# Patient Record
Sex: Male | Born: 1950 | Race: Black or African American | Hispanic: No | Marital: Married | Smoking: Never smoker
Health system: Southern US, Community
[De-identification: ages and names within clinical notes are randomized; demographics above are authoritative.]

## PROBLEM LIST (undated history)

## (undated) DIAGNOSIS — M1990 Unspecified osteoarthritis, unspecified site: Secondary | ICD-10-CM

## (undated) DIAGNOSIS — E119 Type 2 diabetes mellitus without complications: Secondary | ICD-10-CM

## (undated) DIAGNOSIS — J984 Other disorders of lung: Secondary | ICD-10-CM

## (undated) DIAGNOSIS — K219 Gastro-esophageal reflux disease without esophagitis: Secondary | ICD-10-CM

## (undated) DIAGNOSIS — J45909 Unspecified asthma, uncomplicated: Secondary | ICD-10-CM

## (undated) DIAGNOSIS — J449 Chronic obstructive pulmonary disease, unspecified: Secondary | ICD-10-CM

## (undated) DIAGNOSIS — G4730 Sleep apnea, unspecified: Secondary | ICD-10-CM

## (undated) DIAGNOSIS — I519 Heart disease, unspecified: Secondary | ICD-10-CM

## (undated) DIAGNOSIS — R053 Chronic cough: Secondary | ICD-10-CM

## (undated) DIAGNOSIS — J189 Pneumonia, unspecified organism: Secondary | ICD-10-CM

## (undated) DIAGNOSIS — J329 Chronic sinusitis, unspecified: Secondary | ICD-10-CM

## (undated) DIAGNOSIS — E785 Hyperlipidemia, unspecified: Secondary | ICD-10-CM

## (undated) HISTORY — PX: TUMOR REMOVAL: SHX12

## (undated) HISTORY — DX: Other disorders of lung: J98.4

## (undated) HISTORY — PX: ELBOW SURGERY: SHX618

## (undated) HISTORY — DX: Type 2 diabetes mellitus without complications: E11.9

## (undated) HISTORY — PX: OTHER SURGICAL HISTORY: SHX169

## (undated) HISTORY — PX: CIRCUMCISION: SUR203

## (undated) HISTORY — PX: TOE AMPUTATION: SHX809

## (undated) HISTORY — PX: ROTATOR CUFF REPAIR: SHX139

## (undated) HISTORY — DX: Gastro-esophageal reflux disease without esophagitis: K21.9

## (undated) HISTORY — PX: UMBILICAL HERNIA REPAIR: SHX196

---

## 2015-05-10 ENCOUNTER — Encounter (HOSPITAL_COMMUNITY): Payer: Self-pay | Admitting: Emergency Medicine

## 2015-05-10 DIAGNOSIS — Y92410 Unspecified street and highway as the place of occurrence of the external cause: Secondary | ICD-10-CM | POA: Diagnosis not present

## 2015-05-10 DIAGNOSIS — Z79899 Other long term (current) drug therapy: Secondary | ICD-10-CM | POA: Insufficient documentation

## 2015-05-10 DIAGNOSIS — Y998 Other external cause status: Secondary | ICD-10-CM | POA: Diagnosis not present

## 2015-05-10 DIAGNOSIS — S8002XA Contusion of left knee, initial encounter: Secondary | ICD-10-CM | POA: Diagnosis not present

## 2015-05-10 DIAGNOSIS — S46911A Strain of unspecified muscle, fascia and tendon at shoulder and upper arm level, right arm, initial encounter: Secondary | ICD-10-CM | POA: Insufficient documentation

## 2015-05-10 DIAGNOSIS — Y9389 Activity, other specified: Secondary | ICD-10-CM | POA: Insufficient documentation

## 2015-05-10 DIAGNOSIS — S0990XA Unspecified injury of head, initial encounter: Secondary | ICD-10-CM | POA: Diagnosis not present

## 2015-05-10 DIAGNOSIS — S199XXA Unspecified injury of neck, initial encounter: Secondary | ICD-10-CM | POA: Insufficient documentation

## 2015-05-10 DIAGNOSIS — E785 Hyperlipidemia, unspecified: Secondary | ICD-10-CM | POA: Insufficient documentation

## 2015-05-10 DIAGNOSIS — S8992XA Unspecified injury of left lower leg, initial encounter: Secondary | ICD-10-CM | POA: Diagnosis present
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
## 2015-05-11 ENCOUNTER — Emergency Department (HOSPITAL_COMMUNITY)
Admission: EM | Admit: 2015-05-11 | Discharge: 2015-05-11 | Disposition: A | Discharge status: Home / Self Care | Payer: No Typology Code available for payment source | Attending: Emergency Medicine | Admitting: Emergency Medicine

## 2015-05-11 ENCOUNTER — Emergency Department (HOSPITAL_COMMUNITY): Payer: No Typology Code available for payment source

## 2015-05-11 DIAGNOSIS — S8002XA Contusion of left knee, initial encounter: Secondary | ICD-10-CM

## 2015-05-11 DIAGNOSIS — S46811A Strain of other muscles, fascia and tendons at shoulder and upper arm level, right arm, initial encounter: Secondary | ICD-10-CM

## 2015-05-11 DIAGNOSIS — M542 Cervicalgia: Secondary | ICD-10-CM

## 2015-05-11 HISTORY — DX: Hyperlipidemia, unspecified: E78.5

## 2015-05-11 MED ORDER — CYCLOBENZAPRINE HCL 5 MG PO TABS: 5.0000 mg | ORAL_TABLET | Freq: Three times a day (TID) | ORAL | Status: DC | PRN

## 2015-05-11 MED ORDER — NAPROXEN 500 MG PO TABS: 500.0000 mg | ORAL_TABLET | Freq: Two times a day (BID) | ORAL | Status: DC

## 2015-05-11 MED ORDER — NAPROXEN 250 MG PO TABS
500.0000 mg | ORAL_TABLET | Freq: Once | ORAL | Status: AC
  Administered 2015-05-11: 500 mg via ORAL
  Filled 2015-05-11: qty 2

## 2015-05-11 MED ORDER — TRAMADOL HCL 50 MG PO TABS: 100.0000 mg | ORAL_TABLET | Freq: Four times a day (QID) | ORAL | Status: DC | PRN

## 2015-05-11 MED ORDER — METHOCARBAMOL 500 MG PO TABS
1000.0000 mg | ORAL_TABLET | Freq: Once | ORAL | Status: AC
  Administered 2015-05-11: 1000 mg via ORAL
  Filled 2015-05-11: qty 2
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
## 2015-05-11 NOTE — Discharge Instructions (Signed)
Wear the soft collar for comfort. Call [facility] to have your neck reexamined and to evaluate your CT scan of your neck. Use ice packs and heat to the injured areas. Take the medications as prescribed and you can also take acetaminophen 650 mg 4 times a day. Return to the ED for any problems listed on the head injury sheet, or if you get weakness or numbness in your arms or legs.

## 2015-06-24 ENCOUNTER — Emergency Department (HOSPITAL_COMMUNITY)
Admission: EM | Admit: 2015-06-24 | Discharge: 2015-06-24 | Disposition: A | Discharge status: Home / Self Care | Payer: TRICARE For Life (TFL) | Attending: Emergency Medicine | Admitting: Emergency Medicine

## 2015-06-24 ENCOUNTER — Emergency Department (HOSPITAL_COMMUNITY): Payer: TRICARE For Life (TFL)

## 2015-06-24 ENCOUNTER — Encounter (HOSPITAL_COMMUNITY): Payer: Self-pay | Admitting: Emergency Medicine

## 2015-06-24 DIAGNOSIS — R519 Headache, unspecified: Secondary | ICD-10-CM

## 2015-06-24 DIAGNOSIS — Z87828 Personal history of other (healed) physical injury and trauma: Secondary | ICD-10-CM | POA: Insufficient documentation

## 2015-06-24 DIAGNOSIS — Z79899 Other long term (current) drug therapy: Secondary | ICD-10-CM | POA: Insufficient documentation

## 2015-06-24 DIAGNOSIS — R110 Nausea: Secondary | ICD-10-CM | POA: Insufficient documentation

## 2015-06-24 DIAGNOSIS — R51 Headache: Secondary | ICD-10-CM | POA: Diagnosis not present

## 2015-06-24 LAB — I-STAT CHEM 8, ED
BUN: 12 mg/dL (ref 6–20)
CALCIUM ION: 1.14 mmol/L (ref 1.13–1.30)
CREATININE: 1.1 mg/dL (ref 0.61–1.24)
Chloride: 103 mmol/L (ref 101–111)
Glucose, Bld: 133 mg/dL — ABNORMAL HIGH (ref 65–99)
HCT: 40 % (ref 39.0–52.0)
HEMOGLOBIN: 13.6 g/dL (ref 13.0–17.0)
POTASSIUM: 4.8 mmol/L (ref 3.5–5.1)
Sodium: 139 mmol/L (ref 135–145)
TCO2: 26 mmol/L (ref 0–100)

## 2015-06-24 MED ORDER — DEXAMETHASONE SODIUM PHOSPHATE 4 MG/ML IJ SOLN
10.0000 mg | Freq: Once | INTRAMUSCULAR | Status: AC
  Administered 2015-06-24: 10 mg via INTRAVENOUS
  Filled 2015-06-24: qty 3

## 2015-06-24 MED ORDER — IOHEXOL 350 MG/ML SOLN
75.0000 mL | Freq: Once | INTRAVENOUS | Status: AC | PRN
  Administered 2015-06-24: 75 mL via INTRAVENOUS

## 2015-06-24 MED ORDER — VALPROATE SODIUM 500 MG/5ML IV SOLN
500.0000 mg | Freq: Once | INTRAVENOUS | Status: AC
  Administered 2015-06-24: 500 mg via INTRAVENOUS
  Filled 2015-06-24: qty 5

## 2015-06-24 MED ORDER — NAPROXEN 500 MG PO TABS: 500.0000 mg | ORAL_TABLET | Freq: Two times a day (BID) | ORAL | Status: DC

## 2015-06-24 MED ORDER — DIPHENHYDRAMINE HCL 50 MG/ML IJ SOLN
25.0000 mg | Freq: Once | INTRAMUSCULAR | Status: AC
  Administered 2015-06-24: 25 mg via INTRAVENOUS
  Filled 2015-06-24: qty 1

## 2015-06-24 MED ORDER — METOCLOPRAMIDE HCL 5 MG/ML IJ SOLN
10.0000 mg | Freq: Once | INTRAMUSCULAR | Status: AC
  Administered 2015-06-24: 10 mg via INTRAVENOUS
  Filled 2015-06-24: qty 2

## 2015-06-24 MED ORDER — MORPHINE SULFATE 4 MG/ML IJ SOLN
4.0000 mg | Freq: Once | INTRAMUSCULAR | Status: AC
  Administered 2015-06-24: 4 mg via INTRAVENOUS
  Filled 2015-06-24: qty 1

## 2015-06-24 MED ORDER — KETOROLAC TROMETHAMINE 30 MG/ML IJ SOLN
30.0000 mg | Freq: Once | INTRAMUSCULAR | Status: AC
  Administered 2015-06-24: 30 mg via INTRAVENOUS
  Filled 2015-06-24: qty 1

## 2015-06-24 NOTE — Discharge Instructions (Signed)
General Headache Without Cause Call Dr. Merlene Laughter for an appointment as soon as possible. Take antibiotics or this prescribed. Return to the ED if you develop worsening headache, weakness, numbness or tingling or any other concerns. A headache is pain or discomfort felt around the head or neck area. The specific cause of a headache may not be found. There are many causes and types of headaches. A few common ones are:  Tension headaches.  Migraine headaches.  Cluster headaches.  Chronic daily headaches. HOME CARE INSTRUCTIONS   Keep all follow-up appointments with your caregiver or any specialist referral.  Only take over-the-counter or prescription medicines for pain or discomfort as directed by your caregiver.  Lie down in a dark, quiet room when you have a headache.  Keep a headache journal to find out what may trigger your migraine headaches. For example, write down:  What you eat and drink.  How much sleep you get.  Any change to your diet or medicines.  Try massage or other relaxation techniques.  Put ice packs or heat on the head and neck. Use these 3 to 4 times per day for 15 to 20 minutes each time, or as needed.  Limit stress.  Sit up straight, and do not tense your muscles.  Quit smoking if you smoke.  Limit alcohol use.  Decrease the amount of caffeine you drink, or stop drinking caffeine.  Eat and sleep on a regular schedule.  Get 7 to 9 hours of sleep, or as recommended by your caregiver.  Keep lights dim if bright lights bother you and make your headaches worse. SEEK MEDICAL CARE IF:   You have problems with the medicines you were prescribed.  Your medicines are not working.  You have a change from the usual headache.  You have nausea or vomiting. SEEK IMMEDIATE MEDICAL CARE IF:   Your headache becomes severe.  You have a fever.  You have a stiff neck.  You have loss of vision.  You have muscular weakness or loss of muscle control.  You  start losing your balance or have trouble walking.  You feel faint or pass out.  You have severe symptoms that are different from your first symptoms. MAKE SURE YOU:   Understand these instructions.  Will watch your condition.  Will get help right away if you are not doing well or get worse. Document Released: 12/11/2005 Document Revised: 03/04/2012 Document Reviewed: 12/27/2011 [facility] Patient Information 2015 [address], [place]. This information is not intended to replace advice given to you by your health care provider. Make sure you discuss any questions you have with your health care provider.
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
## 2016-02-27 ENCOUNTER — Emergency Department (HOSPITAL_COMMUNITY)

## 2016-02-27 ENCOUNTER — Encounter (HOSPITAL_COMMUNITY): Payer: Self-pay | Admitting: Emergency Medicine

## 2016-02-27 ENCOUNTER — Emergency Department (HOSPITAL_COMMUNITY)
Admission: EM | Admit: 2016-02-27 | Discharge: 2016-02-27 | Disposition: A | Discharge status: Home / Self Care | Attending: Emergency Medicine | Admitting: Emergency Medicine

## 2016-02-27 DIAGNOSIS — E785 Hyperlipidemia, unspecified: Secondary | ICD-10-CM | POA: Insufficient documentation

## 2016-02-27 DIAGNOSIS — J111 Influenza due to unidentified influenza virus with other respiratory manifestations: Secondary | ICD-10-CM | POA: Insufficient documentation

## 2016-02-27 DIAGNOSIS — R509 Fever, unspecified: Secondary | ICD-10-CM | POA: Diagnosis present

## 2016-02-27 DIAGNOSIS — Z79899 Other long term (current) drug therapy: Secondary | ICD-10-CM | POA: Insufficient documentation

## 2016-02-27 LAB — CBC WITH DIFFERENTIAL/PLATELET
BASOS ABS: 0 10*3/uL (ref 0.0–0.1)
BASOS PCT: 0 %
EOS ABS: 0.1 10*3/uL (ref 0.0–0.7)
Eosinophils Relative: 2 %
HCT: 38.1 % — ABNORMAL LOW (ref 39.0–52.0)
Hemoglobin: 12.3 g/dL — ABNORMAL LOW (ref 13.0–17.0)
Lymphocytes Relative: 12 %
Lymphs Abs: 0.8 10*3/uL (ref 0.7–4.0)
MCH: 23.7 pg — AB (ref 26.0–34.0)
MCHC: 32.3 g/dL (ref 30.0–36.0)
MCV: 73.6 fL — ABNORMAL LOW (ref 78.0–100.0)
Monocytes Absolute: 0.8 10*3/uL (ref 0.1–1.0)
Monocytes Relative: 12 %
Neutro Abs: 5.1 10*3/uL (ref 1.7–7.7)
Neutrophils Relative %: 75 %
PLATELETS: 117 10*3/uL — AB (ref 150–400)
RBC: 5.18 MIL/uL (ref 4.22–5.81)
RDW: 13.4 % (ref 11.5–15.5)
WBC: 6.8 10*3/uL (ref 4.0–10.5)

## 2016-02-27 LAB — COMPREHENSIVE METABOLIC PANEL
ALBUMIN: 4.2 g/dL (ref 3.5–5.0)
ALK PHOS: 69 U/L (ref 38–126)
ALT: 23 U/L (ref 17–63)
ANION GAP: 7 (ref 5–15)
AST: 29 U/L (ref 15–41)
BILIRUBIN TOTAL: 1.5 mg/dL — AB (ref 0.3–1.2)
BUN: 10 mg/dL (ref 6–20)
CO2: 26 mmol/L (ref 22–32)
Calcium: 8.8 mg/dL — ABNORMAL LOW (ref 8.9–10.3)
Chloride: 105 mmol/L (ref 101–111)
Creatinine, Ser: 1.42 mg/dL — ABNORMAL HIGH (ref 0.61–1.24)
GFR calc Af Amer: 59 mL/min — ABNORMAL LOW (ref >60)
GFR, EST NON AFRICAN AMERICAN: 51 mL/min — AB (ref >60)
Glucose, Bld: 128 mg/dL — ABNORMAL HIGH (ref 65–99)
POTASSIUM: 3.8 mmol/L (ref 3.5–5.1)
Sodium: 138 mmol/L (ref 135–145)
TOTAL PROTEIN: 7.2 g/dL (ref 6.5–8.1)

## 2016-02-27 MED ORDER — KETOROLAC TROMETHAMINE 30 MG/ML IJ SOLN
30.0000 mg | Freq: Once | INTRAMUSCULAR | Status: AC
  Administered 2016-02-27: 30 mg via INTRAVENOUS
  Filled 2016-02-27: qty 1

## 2016-02-27 MED ORDER — SODIUM CHLORIDE 0.9 % IV BOLUS (SEPSIS)
1000.0000 mL | Freq: Once | INTRAVENOUS | Status: AC
  Administered 2016-02-27: 1000 mL via INTRAVENOUS

## 2016-02-27 MED ORDER — OSELTAMIVIR PHOSPHATE 75 MG PO CAPS
75.0000 mg | ORAL_CAPSULE | Freq: Once | ORAL | Status: AC
  Administered 2016-02-27: 75 mg via ORAL
  Filled 2016-02-27: qty 1

## 2016-02-27 MED ORDER — HYDROCODONE-ACETAMINOPHEN 5-325 MG PO TABS: 1.0000 | ORAL_TABLET | Freq: Four times a day (QID) | ORAL | Status: DC | PRN

## 2016-02-27 MED ORDER — HYDROMORPHONE HCL 1 MG/ML IJ SOLN
1.0000 mg | Freq: Once | INTRAMUSCULAR | Status: AC
  Administered 2016-02-27: 1 mg via INTRAVENOUS
  Filled 2016-02-27: qty 1

## 2016-02-27 MED ORDER — HYDROCODONE-ACETAMINOPHEN 5-325 MG PO TABS
1.0000 | ORAL_TABLET | Freq: Once | ORAL | Status: AC
  Administered 2016-02-27: 1 via ORAL
  Filled 2016-02-27: qty 1

## 2016-02-27 MED ORDER — ONDANSETRON HCL 4 MG/2ML IJ SOLN
4.0000 mg | Freq: Once | INTRAMUSCULAR | Status: AC
  Administered 2016-02-27: 4 mg via INTRAVENOUS
  Filled 2016-02-27: qty 2

## 2016-02-27 MED ORDER — OSELTAMIVIR PHOSPHATE 75 MG PO CAPS: 75.0000 mg | ORAL_CAPSULE | Freq: Two times a day (BID) | ORAL | Status: DC

## 2016-02-27 NOTE — Discharge Instructions (Signed)
Drink plenty of fluids and rest. Follow-up if not improving in next 2-3 days

## 2016-02-27 NOTE — ED Notes (Signed)
Patient c/o cough with fever, body aches, chills, and diarrhea. Denies any nausea or vomiting. Per patient cough nonproductive.

## 2016-02-27 NOTE — ED Provider Notes (Signed)
CSN: [id number]     Arrival date & time 02/27/16  [id number] History  By signing my name below, I, Darrell Young, attest that this documentation has been prepared under the direction and in the presence of Milton Ferguson, MD. Electronically Signed: Helane Young, ED Scribe. 02/27/2016. 9:07 AM.     Chief Complaint  Patient presents with  . Fever   Patient is a 65 y.o. male presenting with fever. The history is provided by the patient and the spouse. No language interpreter was used.  Fever Max temp prior to arrival:  103.1 Onset quality:  Gradual Duration:  2 days Timing:  Constant Progression:  Unchanged Chronicity:  New Associated symptoms: chills, congestion, cough, diarrhea, headaches and myalgias   Associated symptoms: no chest pain, no nausea, no rash and no vomiting   Congestion:    Location:  Nasal Cough:    Cough characteristics:  Non-productive   Severity:  Moderate   Onset quality:  Gradual   Duration:  2 days   Timing:  Intermittent   Progression:  Unchanged   Chronicity:  New Headaches:    Severity:  Mild   Onset quality:  Gradual   Duration:  2 days   Timing:  Constant   Progression:  Unchanged   Chronicity:  New Myalgias:    Location:  Generalized   Quality:  Aching   Severity:  Mild   Onset quality:  Gradual   Duration:  2 days   Timing:  Constant   Progression:  Unchanged  HPI Comments: Darrell Young is a 65 y.o. male with a PMHx of HLD who presents to the Emergency Department complaining of fever (Tmax 103.1) onset 2 days ago. He reports associated chills, non-productive cough, congestion, generalized myalgias, mild neck stiffness, occipital headache, and diarrhea. He notes he has not gotten a flu shot this year. Pt denies n/v and abdominal pain.    Past Medical History  Diagnosis Date  . Hyperlipidemia    Past Surgical History  Procedure Laterality Date  . Umbilical hernia repair    . Tumor removal     History reviewed. No pertinent family  history. Social History  Substance Use Topics  . Smoking status: Never Smoker   . Smokeless tobacco: Never Used  . Alcohol Use: No    Review of Systems  Constitutional: Positive for fever and chills. Negative for appetite change and fatigue.  HENT: Positive for congestion. Negative for ear discharge and sinus pressure.   Eyes: Negative for discharge.  Respiratory: Positive for cough.   Cardiovascular: Negative for chest pain.  Gastrointestinal: Positive for diarrhea. Negative for nausea, vomiting and abdominal pain.  Genitourinary: Negative for frequency and hematuria.  Musculoskeletal: Positive for myalgias and neck stiffness. Negative for back pain.  Skin: Negative for rash.  Neurological: Positive for headaches. Negative for seizures.  Psychiatric/Behavioral: Negative for hallucinations.    Allergies  Review of patient's allergies indicates no known allergies.  Home Medications   Prior to Admission medications   Medication Sig Start Date End Date Taking? Authorizing Provider  APPLE CIDER VINEGAR PO Take 1 capsule by mouth 2 (two) times daily.   Yes Historical Provider, MD  atorvastatin (LIPITOR) 80 MG tablet Take 80 mg by mouth at bedtime.   Yes Historical Provider, MD  CINNAMON PO Take 2 tablets by mouth daily.   Yes Historical Provider, MD  dextromethorphan-guaiFENesin (MUCINEX DM) 30-600 MG 12hr tablet Take 2 tablets by mouth 2 (two) times daily as needed for cough.   Yes  Historical Provider, MD  docusate sodium (COLACE) 100 MG capsule Take 100 mg by mouth 2 (two) times daily.   Yes Historical Provider, MD  cyclobenzaprine (FLEXERIL) 5 MG tablet Take 1 tablet (5 mg total) by mouth 3 (three) times daily as needed. Patient not taking: Reported on 06/24/2015 05/11/15   Rolland Porter, MD  naproxen (NAPROSYN) 500 MG tablet Take 1 tablet (500 mg total) by mouth 2 (two) times daily. Patient not taking: Reported on 02/27/2016 06/24/15   Ezequiel Essex, MD  traMADol (ULTRAM) 50 MG tablet  Take 2 tablets (100 mg total) by mouth every 6 (six) hours as needed. Patient not taking: Reported on 06/24/2015 05/11/15   Rolland Porter, MD   BP 122/82 mmHg  Pulse 101  Temp(Src) 101.8 F (38.8 C) (Oral)  Resp 22  Ht 6\' 4"  (1.93 m)  Wt 250 lb (113.399 kg)  BMI 30.44 kg/m2  SpO2 96% Physical Exam  Constitutional: He is oriented to person, place, and time. He appears well-developed.  HENT:  Head: Normocephalic.  Eyes: Conjunctivae and EOM are normal. No scleral icterus.  Neck: Neck supple. No thyromegaly present.  Cardiovascular: Normal rate and regular rhythm.  Exam reveals no gallop and no friction rub.   No murmur heard. Pulmonary/Chest: No stridor. He has no wheezes. He has no rales. He exhibits no tenderness.  Abdominal: He exhibits no distension. There is no tenderness. There is no rebound.  Musculoskeletal: Normal range of motion. He exhibits no edema.  Lymphadenopathy:    He has no cervical adenopathy.  Neurological: He is oriented to person, place, and time. He exhibits normal muscle tone. Coordination normal.  Skin: No rash noted. No erythema.  Psychiatric: He has a normal mood and affect. His behavior is normal.    ED Course  Procedures  DIAGNOSTIC STUDIES: Oxygen Saturation is 96% on RA, adequate by my interpretation.    COORDINATION OF CARE: 9:02 AM - Discussed probable flu and plans to order a migraine cocktail, as well as diagnostic studies. Pt advised of plan for treatment and pt agrees.  Labs Review Labs Reviewed  CBC WITH DIFFERENTIAL/PLATELET - Abnormal; Notable for the following:    Hemoglobin 12.3 (*)    HCT 38.1 (*)    MCV 73.6 (*)    MCH 23.7 (*)    All other components within normal limits  COMPREHENSIVE METABOLIC PANEL - Abnormal; Notable for the following:    Glucose, Bld 128 (*)    Creatinine, Ser 1.42 (*)    Calcium 8.8 (*)    Total Bilirubin 1.5 (*)    GFR calc non Af Amer 51 (*)    GFR calc Af Amer 59 (*)    All other components within  normal limits    Imaging Review Dg Chest 2 View  02/27/2016  CLINICAL DATA:  Fever, generalized body aches and cough beginning 2 days ago EXAM: CHEST  2 VIEW COMPARISON:  05/11/2015 FINDINGS: Normal heart size, mediastinal contours, and pulmonary vascularity. Lungs clear. No pneumothorax. Scattered degenerative changes thoracic spine. IMPRESSION: No acute abnormalities. Electronically Signed   By: Lavonia Dana M.D.   On: 02/27/2016 09:51   I have personally reviewed and evaluated these images and lab results as part of my medical decision-making.   EKG Interpretation None      MDM   Final diagnoses:  None    Influenza labs unremarkable. Patient will be treated with Tamiflu and Vicodin and play fluids and rest and follow-up as needed  The chart was  scribed for me under my direct supervision.  I personally performed the history, physical, and medical decision making and all procedures in the evaluation of this patient.Milton Ferguson, MD 02/27/16 [phone]

## 2016-07-04 IMAGING — DX DG CHEST 2V
2 series · 2 of 2 positions shown · non-contrast
Comparison: 05/11/2015

CLINICAL DATA: Fever, generalized body aches and cough beginning 2
days ago

EXAM:
CHEST  2 VIEW

[chest pa]
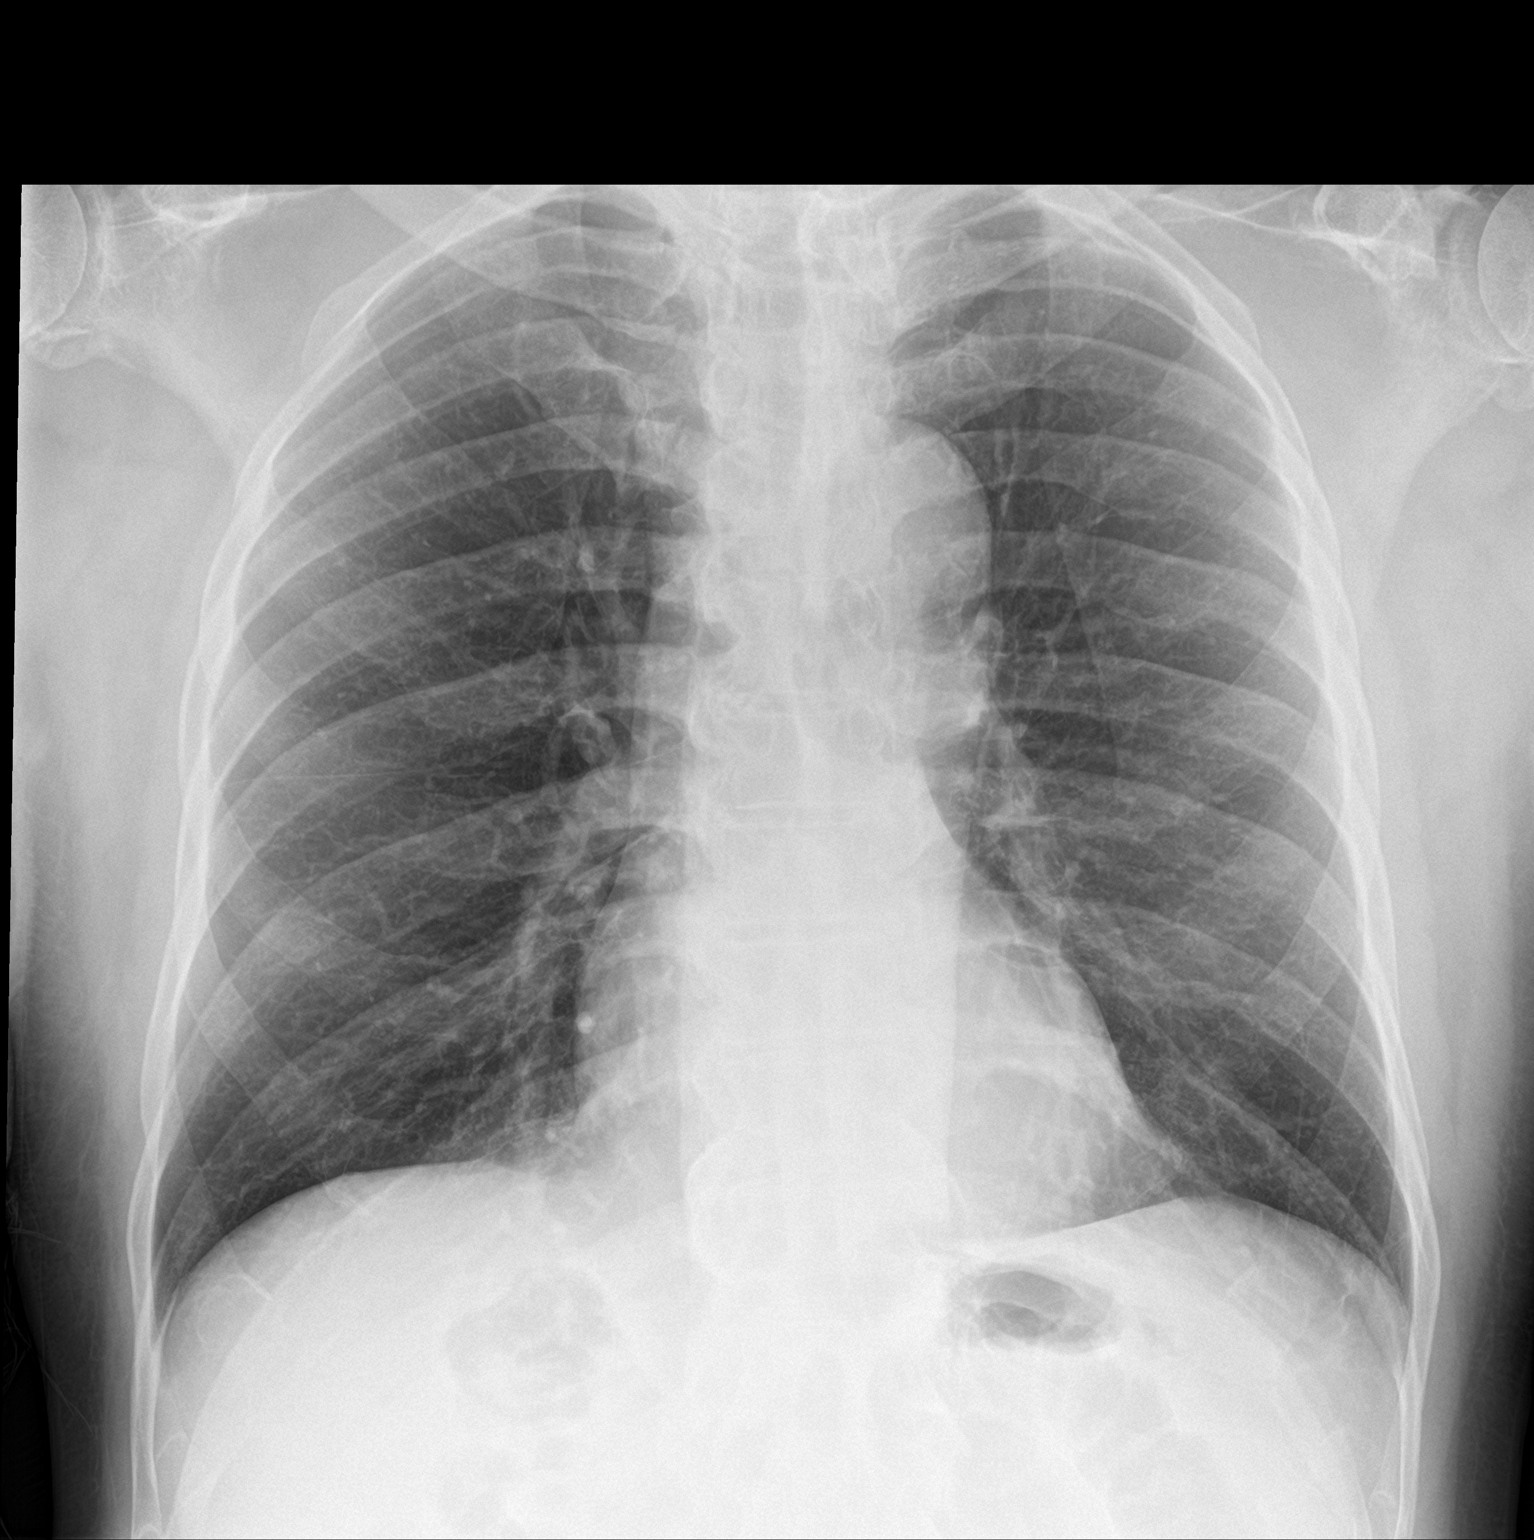

[chest lat]
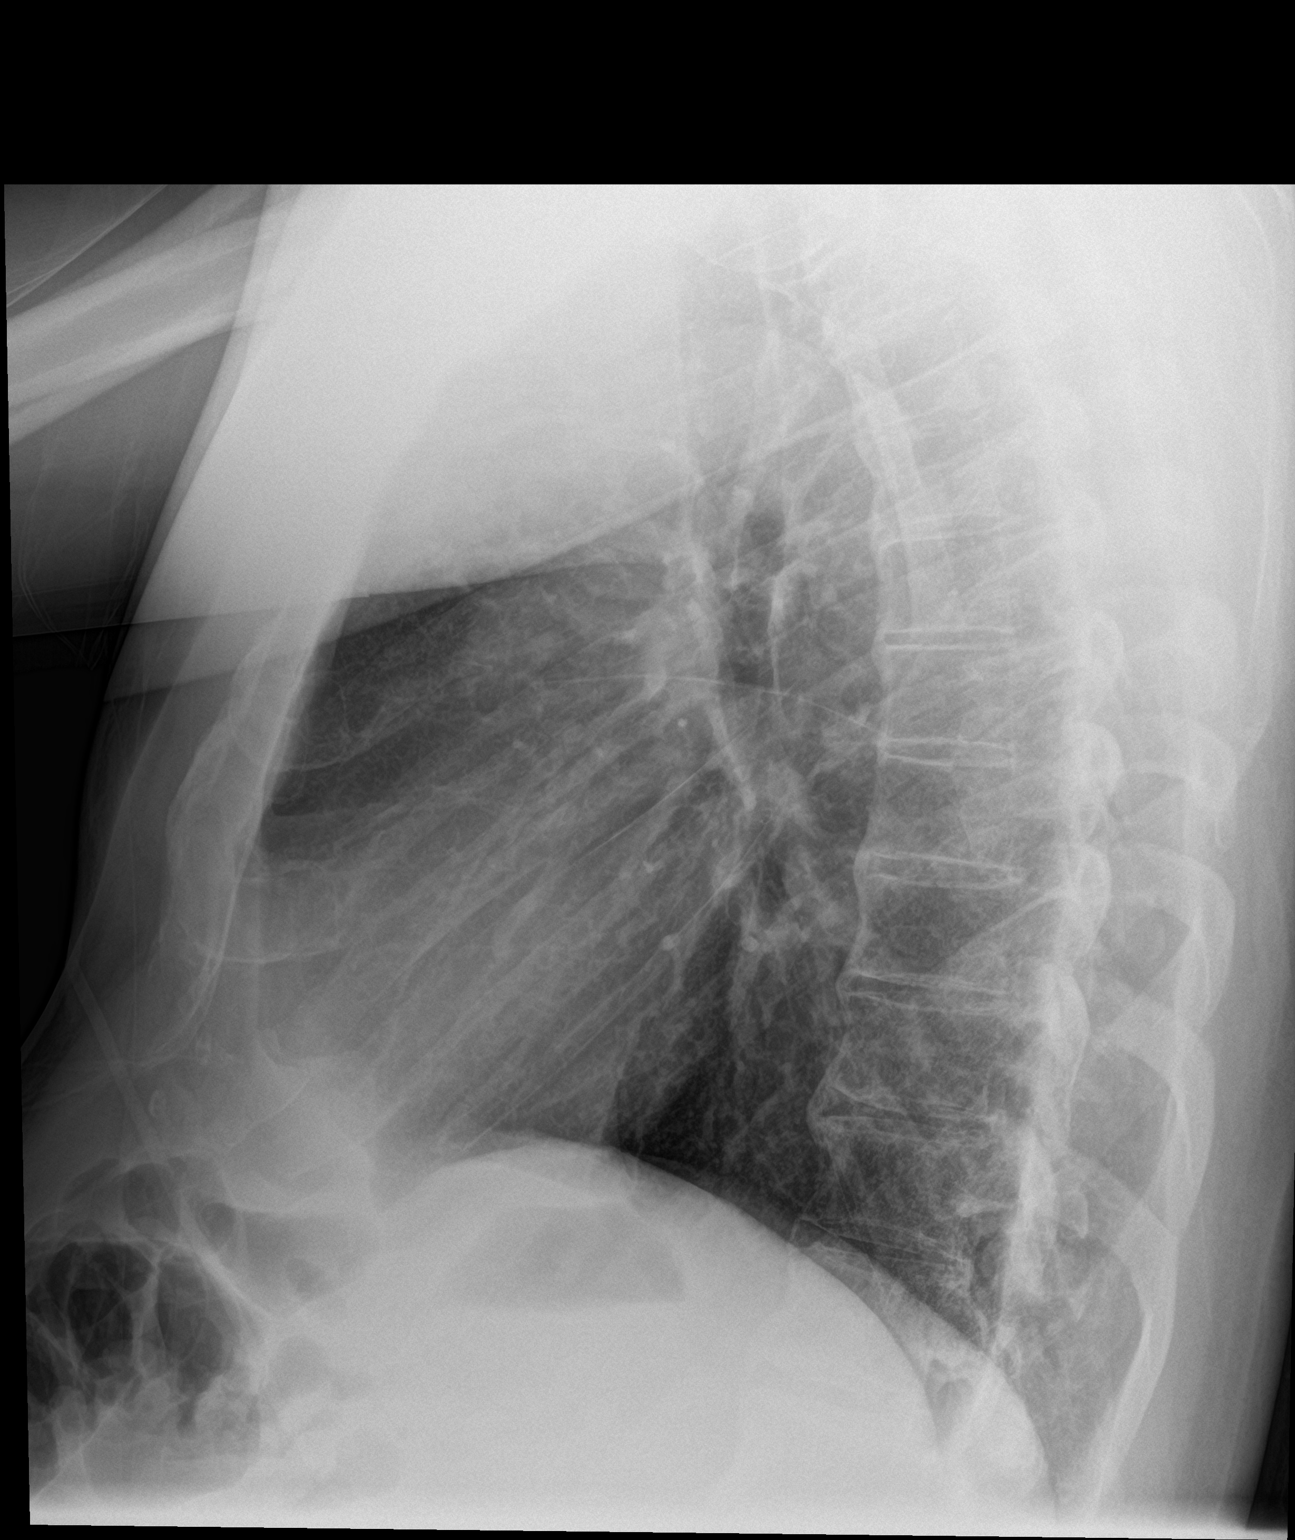

[2 of 2 positions shown; findings below may reference images not displayed]

FINDINGS: Normal heart size, mediastinal contours, and pulmonary vascularity.

Lungs clear.

No pneumothorax.

Scattered degenerative changes thoracic spine.
IMPRESSION: No acute abnormalities.

## 2017-09-30 ENCOUNTER — Emergency Department (HOSPITAL_COMMUNITY)
Admission: EM | Admit: 2017-09-30 | Discharge: 2017-09-30 | Disposition: A | Discharge status: Home / Self Care | Payer: Non-veteran care | Attending: Emergency Medicine | Admitting: Emergency Medicine

## 2017-09-30 ENCOUNTER — Encounter (HOSPITAL_COMMUNITY): Payer: Self-pay

## 2017-09-30 DIAGNOSIS — B349 Viral infection, unspecified: Secondary | ICD-10-CM | POA: Insufficient documentation

## 2017-09-30 DIAGNOSIS — Z79899 Other long term (current) drug therapy: Secondary | ICD-10-CM | POA: Diagnosis not present

## 2017-09-30 DIAGNOSIS — R531 Weakness: Secondary | ICD-10-CM | POA: Diagnosis present

## 2017-09-30 LAB — INFLUENZA PANEL BY PCR (TYPE A & B)
Influenza A By PCR: NEGATIVE
Influenza B By PCR: NEGATIVE

## 2017-09-30 NOTE — ED Triage Notes (Addendum)
Pt reports that he started getting sick Friday. Pt reports body aches, fatigue, nausea sweating. Fever up to 102.7 last night. Pt reports that he has been on sofa all weekend. Pt is currently on antibiotics for dental abscess

## 2017-09-30 NOTE — Discharge Instructions (Signed)
Your flu test is negative today.  I suspect you do have a viral infection but also suspect it is improving since you feel better today.  Continue to rest and drink plenty of fluids.  Take tylenol or motrin if needed for fever and headache.

## 2017-10-02 NOTE — ED Provider Notes (Signed)
[place] DEPT Provider Note   CSN: [id number] Arrival date & time: 09/30/17  1101     History   Chief Complaint Chief Complaint  Patient presents with  . Generalized Body Aches    HPI Darrell Young is a 66 y.o. male presenting with a 2 day history history of flu like symptoms which includes generalized body aches, clear nasal drainage, fatigue, vague nausea (without diarrhea), fever to 102.7 and fatigue.  Symptoms do not include shortness of breath, chest pain,  vomiting or diarrhea, rash, neck or throat pain. He is currently on day 7 of a 10 day course of amoxil for a dental infection which is improved in swellling andpain. The patient has taken tylenol early this morning for fever reduction with appropriate response.  He has not received his flu vaccine.  The history is provided by the patient and the spouse.    Past Medical History:  Diagnosis Date  . Hyperlipidemia     There are no active problems to display for this patient.   Past Surgical History:  Procedure Laterality Date  . TUMOR REMOVAL    . UMBILICAL HERNIA REPAIR         Home Medications    Prior to Admission medications   Medication Sig Start Date End Date Taking? Authorizing Provider  APPLE CIDER VINEGAR PO Take 1 capsule by mouth 2 (two) times daily.    [provider]  atorvastatin (LIPITOR) 80 MG tablet Take 80 mg by mouth at bedtime.    [provider]  CINNAMON PO Take 2 tablets by mouth daily.    [provider]  cyclobenzaprine (FLEXERIL) 5 MG tablet Take 1 tablet (5 mg total) by mouth 3 (three) times daily as needed. Patient not taking: Reported on 06/24/2015 05/11/15   Rolland Porter, MD  dextromethorphan-guaiFENesin [facility] DM) 30-600 MG 12hr tablet Take 2 tablets by mouth 2 (two) times daily as needed for cough.    [provider]  docusate sodium (COLACE) 100 MG capsule Take 100 mg by mouth 2 (two) times daily.    [provider]    HYDROcodone-acetaminophen (NORCO/VICODIN) 5-325 MG tablet Take 1 tablet by mouth every 6 (six) hours as needed for moderate pain. 02/27/16   Milton Ferguson, MD  naproxen (NAPROSYN) 500 MG tablet Take 1 tablet (500 mg total) by mouth 2 (two) times daily. Patient not taking: Reported on 02/27/2016 06/24/15   Rancour, Annie Main, MD  oseltamivir (TAMIFLU) 75 MG capsule Take 1 capsule (75 mg total) by mouth every 12 (twelve) hours. 02/27/16   Milton Ferguson, MD  traMADol (ULTRAM) 50 MG tablet Take 2 tablets (100 mg total) by mouth every 6 (six) hours as needed. Patient not taking: Reported on 06/24/2015 05/11/15   Rolland Porter, MD    Family History No family history on file.  Social History Social History  Substance Use Topics  . Smoking status: Never Smoker  . Smokeless tobacco: Never Used  . Alcohol use No     Allergies   Aspirin   Review of Systems Review of Systems  Constitutional: Positive for fatigue and fever.  HENT: Negative.  Negative for congestion and sore throat.   Eyes: Negative.   Respiratory: Negative for chest tightness and shortness of breath.   Cardiovascular: Negative for chest pain.  Gastrointestinal: Positive for nausea. Negative for abdominal pain, constipation, diarrhea and vomiting.  Genitourinary: Negative.   Musculoskeletal: Positive for myalgias. Negative for arthralgias, joint swelling, neck pain and neck stiffness.  Skin: Negative.  Negative for rash and wound.  Neurological: Negative for dizziness, weakness, light-headedness, numbness and headaches.  Psychiatric/Behavioral: Negative.      Physical Exam Updated Vital Signs BP 102/65 (BP Location: Right Arm)   Pulse 76   Temp 98.5 F (36.9 C) (Oral)   Resp 16   Wt 108 kg (238 lb)   SpO2 99%   BMI 28.97 kg/m   Physical Exam  Constitutional: He is oriented to person, place, and time. He appears well-developed and well-nourished.  HENT:  Head: Normocephalic and atraumatic.  Right Ear: Tympanic membrane  and ear canal normal.  Left Ear: Tympanic membrane and ear canal normal.  Nose: Rhinorrhea present.  Mouth/Throat: Uvula is midline, oropharynx is clear and moist and mucous membranes are normal. No oropharyngeal exudate, posterior oropharyngeal edema, posterior oropharyngeal erythema or tonsillar abscesses.  Eyes: Conjunctivae are normal.  Cardiovascular: Normal rate and normal heart sounds.   Pulmonary/Chest: Effort normal. No respiratory distress. He has no decreased breath sounds. He has no wheezes. He has no rhonchi. He has no rales.  Abdominal: Soft. There is no tenderness.  Musculoskeletal: Normal range of motion.  Neurological: He is alert and oriented to person, place, and time.  Skin: Skin is warm and dry. No rash noted.  Psychiatric: He has a normal mood and affect.     ED Treatments / Results  Labs (all labs ordered are listed, but only abnormal results are displayed) Labs Reviewed  INFLUENZA PANEL BY PCR (TYPE A & B)    EKG  EKG Interpretation None       Radiology No results found.  Procedures Procedures (including critical care time)  Medications Ordered in ED Medications - No data to display   Initial Impression / Assessment and Plan / ED Course  I have reviewed the triage vital signs and the nursing notes.  Pertinent labs & imaging results that were available during my care of the patient were reviewed by me and considered in my medical decision making (see chart for details).     Pt with fever and no focal findings to suggest bacterial source. Advised rest, increased fluid intake,, tylenol or motrin for persistent or return of fever. Recheck here or by pcp for any new or worsened, persistent sx.  Final Clinical Impressions(s) / ED Diagnoses   Final diagnoses:  Viral illness    New Prescriptions Discharge Medication List as of 09/30/2017  2:37 PM       Evalee Jefferson, PA-C 10/02/17 2200    Forde Dandy, MD 10/03/17 [phone]

## 2018-04-01 ENCOUNTER — Encounter (HOSPITAL_COMMUNITY): Payer: Self-pay | Admitting: Emergency Medicine

## 2018-04-01 ENCOUNTER — Other Ambulatory Visit: Payer: Self-pay

## 2018-04-01 ENCOUNTER — Emergency Department (HOSPITAL_COMMUNITY)
Admission: EM | Admit: 2018-04-01 | Discharge: 2018-04-01 | Disposition: A | Discharge status: Home / Self Care | Payer: Non-veteran care | Attending: Emergency Medicine | Admitting: Emergency Medicine

## 2018-04-01 ENCOUNTER — Emergency Department (HOSPITAL_COMMUNITY): Payer: Non-veteran care

## 2018-04-01 DIAGNOSIS — N419 Inflammatory disease of prostate, unspecified: Secondary | ICD-10-CM | POA: Diagnosis not present

## 2018-04-01 DIAGNOSIS — K625 Hemorrhage of anus and rectum: Secondary | ICD-10-CM | POA: Insufficient documentation

## 2018-04-01 DIAGNOSIS — Z79899 Other long term (current) drug therapy: Secondary | ICD-10-CM | POA: Diagnosis not present

## 2018-04-01 DIAGNOSIS — I7789 Other specified disorders of arteries and arterioles: Secondary | ICD-10-CM

## 2018-04-01 DIAGNOSIS — R1032 Left lower quadrant pain: Secondary | ICD-10-CM | POA: Diagnosis present

## 2018-04-01 DIAGNOSIS — J45909 Unspecified asthma, uncomplicated: Secondary | ICD-10-CM | POA: Diagnosis not present

## 2018-04-01 DIAGNOSIS — I714 Abdominal aortic aneurysm, without rupture: Secondary | ICD-10-CM | POA: Diagnosis not present

## 2018-04-01 DIAGNOSIS — I77811 Abdominal aortic ectasia: Secondary | ICD-10-CM

## 2018-04-01 HISTORY — DX: Unspecified asthma, uncomplicated: J45.909

## 2018-04-01 LAB — COMPREHENSIVE METABOLIC PANEL
ALK PHOS: 83 U/L (ref 38–126)
ALT: 15 U/L — AB (ref 17–63)
AST: 19 U/L (ref 15–41)
Albumin: 4.3 g/dL (ref 3.5–5.0)
Anion gap: 13 (ref 5–15)
BUN: 12 mg/dL (ref 6–20)
CALCIUM: 9 mg/dL (ref 8.9–10.3)
CHLORIDE: 100 mmol/L — AB (ref 101–111)
CO2: 23 mmol/L (ref 22–32)
CREATININE: 1.09 mg/dL (ref 0.61–1.24)
GFR calc non Af Amer: >60 mL/min (ref >60)
GLUCOSE: 281 mg/dL — AB (ref 65–99)
Potassium: 4 mmol/L (ref 3.5–5.1)
Sodium: 136 mmol/L (ref 135–145)
Total Bilirubin: 1.5 mg/dL — ABNORMAL HIGH (ref 0.3–1.2)
Total Protein: 7.6 g/dL (ref 6.5–8.1)

## 2018-04-01 LAB — CBC
HCT: 38.3 % — ABNORMAL LOW (ref 39.0–52.0)
Hemoglobin: 12.2 g/dL — ABNORMAL LOW (ref 13.0–17.0)
MCH: 23.4 pg — AB (ref 26.0–34.0)
MCHC: 31.9 g/dL (ref 30.0–36.0)
MCV: 73.4 fL — AB (ref 78.0–100.0)
Platelets: 138 10*3/uL — ABNORMAL LOW (ref 150–400)
RBC: 5.22 MIL/uL (ref 4.22–5.81)
RDW: 13.3 % (ref 11.5–15.5)
WBC: 8.9 10*3/uL (ref 4.0–10.5)

## 2018-04-01 LAB — TYPE AND SCREEN
ABO/RH(D): B NEG
Antibody Screen: NEGATIVE

## 2018-04-01 MED ORDER — HYDROCODONE-ACETAMINOPHEN 5-325 MG PO TABS: 1.0000 | ORAL_TABLET | Freq: Four times a day (QID) | ORAL | 0 refills | Status: DC | PRN

## 2018-04-01 MED ORDER — IOPAMIDOL (ISOVUE-300) INJECTION 61%
100.0000 mL | Freq: Once | INTRAVENOUS | Status: AC | PRN
  Administered 2018-04-01: 100 mL via INTRAVENOUS

## 2018-04-01 MED ORDER — SULFAMETHOXAZOLE-TRIMETHOPRIM 800-160 MG PO TABS: 1.0000 | ORAL_TABLET | Freq: Two times a day (BID) | ORAL | 0 refills | Status: AC

## 2018-04-01 MED ORDER — SULFAMETHOXAZOLE-TRIMETHOPRIM 800-160 MG PO TABS
1.0000 | ORAL_TABLET | Freq: Once | ORAL | Status: AC
  Administered 2018-04-01: 1 via ORAL
  Filled 2018-04-01: qty 1

## 2018-04-01 MED ORDER — HYDROCODONE-ACETAMINOPHEN 5-325 MG PO TABS
2.0000 | ORAL_TABLET | Freq: Once | ORAL | Status: AC
  Administered 2018-04-01: 2 via ORAL
  Filled 2018-04-01: qty 2

## 2018-04-01 NOTE — ED Provider Notes (Signed)
[facility] EMERGENCY DEPARTMENT Provider Note   CSN: [id number] Arrival date & time: 04/01/18  1356     History   Chief Complaint Chief Complaint  Patient presents with  . Rectal Bleeding    HPI Darrell Young is a 67 y.o. male.  He was seen at the [facility] Tuesday for left-sided flank pain into his left thigh.  He states they did blood work and some x-rays and put him on prednisone.  He is not sure what the diagnosis was.  Since Wednesday he has had blood with every bowel movement.  He states he is passing normal stool 3 times a day and has blood in the bulb with that.  The pain that he was experiencing is now more in his left lower quadrant.  He has had the rectal bleeding before and has had colonoscopies and they blamed it on polyps.  With this episode he has had no fever no dizziness no shortness of breath no chest pain.  He is been eating and drinking well.  He denies any hematuria or urinary symptoms.  He has not had any rectal pain or no signs of mass or hemorrhoid.  The history is provided by the patient.  Rectal Bleeding  Quality:  Bright red Amount:  Moderate Duration:  6 days Chronicity:  Recurrent Context: defecation   Similar prior episodes: yes   Relieved by:  None tried Worsened by:  Defecation Associated symptoms: abdominal pain   Associated symptoms: no dizziness, no epistaxis, no fever, no hematemesis, no light-headedness, no loss of consciousness, no recent illness and no vomiting   Abdominal pain:    Location:  LLQ and L flank   Quality: cramping     Severity:  Moderate   Onset quality:  Gradual   Timing:  Intermittent   Progression:  Waxing and waning   Chronicity:  New Risk factors: steroid use   Risk factors: no anticoagulant use     Past Medical History:  Diagnosis Date  . Asthma   . Hyperlipidemia     There are no active problems to display for this patient.   Past Surgical History:  Procedure Laterality Date  . ELBOW SURGERY    . TOE  AMPUTATION    . TUMOR REMOVAL    . UMBILICAL HERNIA REPAIR          Home Medications    Prior to Admission medications   Medication Sig Start Date End Date Taking? Authorizing Provider  APPLE CIDER VINEGAR PO Take 1 capsule by mouth 2 (two) times daily.    [provider]  atorvastatin (LIPITOR) 80 MG tablet Take 80 mg by mouth at bedtime.    [provider]  CINNAMON PO Take 2 tablets by mouth daily.    [provider]  cyclobenzaprine (FLEXERIL) 5 MG tablet Take 1 tablet (5 mg total) by mouth 3 (three) times daily as needed. Patient not taking: Reported on 06/24/2015 05/11/15   Rolland Porter, MD  dextromethorphan-guaiFENesin [facility] DM) 30-600 MG 12hr tablet Take 2 tablets by mouth 2 (two) times daily as needed for cough.    [provider]  docusate sodium (COLACE) 100 MG capsule Take 100 mg by mouth 2 (two) times daily.    [provider]  HYDROcodone-acetaminophen (NORCO/VICODIN) 5-325 MG tablet Take 1 tablet by mouth every 6 (six) hours as needed for moderate pain. 02/27/16   Milton Ferguson, MD  naproxen (NAPROSYN) 500 MG tablet Take 1 tablet (500 mg total) by mouth 2 (  two) times daily. Patient not taking: Reported on 02/27/2016 06/24/15   Rancour, Annie Main, MD  oseltamivir (TAMIFLU) 75 MG capsule Take 1 capsule (75 mg total) by mouth every 12 (twelve) hours. 02/27/16   Milton Ferguson, MD  traMADol (ULTRAM) 50 MG tablet Take 2 tablets (100 mg total) by mouth every 6 (six) hours as needed. Patient not taking: Reported on 06/24/2015 05/11/15   Rolland Porter, MD    Family History History reviewed. No pertinent family history.  Social History Social History   Tobacco Use  . Smoking status: Never Smoker  . Smokeless tobacco: Never Used  Substance Use Topics  . Alcohol use: No  . Drug use: No     Allergies   Aspirin   Review of Systems Review of Systems  Constitutional: Negative for chills and fever.  HENT: Negative for ear pain, nosebleeds  and sore throat.   Eyes: Negative for pain and visual disturbance.  Respiratory: Negative for cough and shortness of breath.   Cardiovascular: Negative for chest pain and palpitations.  Gastrointestinal: Positive for abdominal pain, anal bleeding, blood in stool and hematochezia. Negative for constipation, diarrhea, hematemesis, rectal pain and vomiting.  Genitourinary: Negative for dysuria and hematuria.  Musculoskeletal: Negative for arthralgias and back pain.  Skin: Negative for color change and rash.  Neurological: Negative for dizziness, seizures, loss of consciousness, syncope and light-headedness.  All other systems reviewed and are negative.    Physical Exam Updated Vital Signs BP (!) 137/95   Pulse 61   Temp 97.7 F (36.5 C) (Oral)   Resp 16   Ht 6\' 3"  (1.905 m)   Wt 111.1 kg (245 lb)   SpO2 98%   BMI 30.62 kg/m   Physical Exam  Constitutional: He appears well-developed and well-nourished.  HENT:  Head: Normocephalic and atraumatic.  Eyes: Conjunctivae are normal.  Neck: Neck supple.  Cardiovascular: Normal rate and regular rhythm.  No murmur heard. Pulmonary/Chest: Effort normal and breath sounds normal. No respiratory distress.  Abdominal: Soft. Normal appearance. There is tenderness in the left lower quadrant. There is no rigidity, no rebound and no guarding. No hernia.  Musculoskeletal: Normal range of motion. He exhibits no edema, tenderness or deformity.  Neurological: He is alert. He has normal strength. No sensory deficit. Gait normal. GCS eye subscore is 4. GCS verbal subscore is 5. GCS motor subscore is 6.  Skin: Skin is warm and dry.  Psychiatric: He has a normal mood and affect.  Nursing note and vitals reviewed.    ED Treatments / Results  Labs (all labs ordered are listed, but only abnormal results are displayed) Labs Reviewed  COMPREHENSIVE METABOLIC PANEL - Abnormal; Notable for the following components:      Result Value   Chloride 100 (*)      Glucose, Bld 281 (*)    ALT 15 (*)    Total Bilirubin 1.5 (*)    All other components within normal limits  CBC - Abnormal; Notable for the following components:   Hemoglobin 12.2 (*)    HCT 38.3 (*)    MCV 73.4 (*)    MCH 23.4 (*)    Platelets 138 (*)    All other components within normal limits  TYPE AND SCREEN    EKG None  Radiology Ct Abdomen Pelvis W Contrast  Result Date: 04/01/2018 CLINICAL DATA:  Lower abdominal pain.  GI bleeding. EXAM: CT ABDOMEN AND PELVIS WITH CONTRAST TECHNIQUE: Multidetector CT imaging of the abdomen and pelvis was performed using  the standard protocol following bolus administration of intravenous contrast. Patient experienced nausea and vomiting after IV contrast was administered. CONTRAST:  156mL ISOVUE-300 IOPAMIDOL (ISOVUE-300) INJECTION 61% COMPARISON:  None. FINDINGS: Lower chest: Hypoventilatory changes. No pleural fluid or consolidation. Hepatobiliary: Diffusely decreased density consistent with steatosis. No discrete focal lesion. Gallbladder partially distended, no calcified stone. No biliary dilatation. Pancreas: No ductal dilatation or inflammation. Spleen: Normal in size without focal abnormality. Adrenals/Urinary Tract: Normal adrenal glands. No hydronephrosis or perinephric edema. Homogeneous renal enhancement with symmetric excretion on delayed phase imaging. Urinary bladder is partially distended. Mild bladder wall thickening and trace perivesicular stranding. Stomach/Bowel: Stomach is nondistended. Appendix appears normal. No evidence of bowel wall thickening, distention, or inflammatory changes. Mild colonic tortuosity without colonic wall thickening. No significant diverticular disease. Vascular/Lymphatic: Mild to moderate aortic atherosclerosis with infrarenal aortic ectasia, maximal dimension 2.8 cm. No enlarged abdominal or pelvic lymph nodes. Major mesenteric vessels are patent. Reproductive: Heterogeneous enlarged prostate gland spanning  5.7 cm with coarse central calcifications. Other: No free air, free fluid, or intra-abdominal fluid collection. Sequela of prior umbilical hernia repair. Musculoskeletal: There are no acute or suspicious osseous abnormalities. Multilevel degenerative change throughout the lumbar spine with degenerative disc disease and facet arthropathy. Combination of this causes spinal canal stenosis at L1-L2 and L2-L3. degenerative change of both hips. IMPRESSION: 1. Mild bladder wall thickening and perivesicular stranding in the setting of enlarged prostate gland. Findings may be due to chronic bladder outlet obstruction versus cystitis. 2. No significant diverticular disease or evidence diverticulitis. No CT findings to explain GI bleed. 3. Incidental hepatic steatosis. 4. Ectatic abdominal aorta, maximal dimension 2.7 cm, at risk for aneurysm development. Recommend followup by ultrasound in 5 years. This recommendation follows ACR consensus guidelines: White Paper of the ACR Incidental Findings Committee II on Vascular Findings. J Am Coll Radiol 2013; 09:735-329. 5.  Aortic Atherosclerosis (ICD10-I70.0). Electronically Signed   By: Jeb Levering M.D.   On: 04/01/2018 21:30    Procedures Procedures (including critical care time)  Medications Ordered in ED Medications  HYDROcodone-acetaminophen (NORCO/VICODIN) 5-325 MG per tablet 2 tablet (has no administration in time range)     Initial Impression / Assessment and Plan / ED Course  I have reviewed the triage vital signs and the nursing notes.  Pertinent labs & imaging results that were available during my care of the patient were reviewed by me and considered in my medical decision making (see chart for details).  Clinical Course as of Apr 03 1029  Mon Apr 01, 2018  2138 Reviewed the results with the patient.  He is having no urinary symptoms such difficult to put in context Y he would have prostatitis.  He still comfortable starting on some antibiotics  and [facility]   [MB]    Clinical Course User Index [MB] Hayden Rasmussen, MD     Final Clinical Impressions(s) / ED Diagnoses   Final diagnoses:  Rectal bleeding  Prostatitis, unspecified prostatitis type  Enlargement of abdominal aorta [facility])    ED Discharge Orders        Ordered    HYDROcodone-acetaminophen (NORCO/VICODIN) 5-325 MG tablet  Every 6 hours PRN     04/01/18 2141    sulfamethoxazole-trimethoprim (BACTRIM DS,SEPTRA DS) 800-160 MG tablet  2 times daily     04/01/18 2141       Hayden Rasmussen, MD 04/03/18 1031

## 2018-04-01 NOTE — ED Triage Notes (Signed)
Pt states having rectal bleeding for three days. Started as black and now is BRB per rectum. Pt also complaining of left lower severe pain. Pt states BRB without bowel movement now.

## 2018-04-01 NOTE — Discharge Instructions (Signed)
Your evaluated in the emergency department for some rectal bleeding in the setting of abdominal pain.  We did not find an obvious cause of your bleeding but it was not causing you anemia.  Your CAT scan did show that your prostate was inflamed and this may be the cause of your abdominal pain.  We are prescribing you an antibiotic for that and some pain medicine.  Your aorta was also slightly enlarged and this will need to be followed with a repeat ultrasound within the next 5 years.  Your doctor can schedule these.

## 2018-04-19 ENCOUNTER — Encounter: Payer: Self-pay | Admitting: Gastroenterology

## 2018-07-08 ENCOUNTER — Ambulatory Visit: Payer: No Typology Code available for payment source | Admitting: Nurse Practitioner

## 2018-07-18 ENCOUNTER — Encounter: Payer: Self-pay | Admitting: Nurse Practitioner

## 2018-07-18 ENCOUNTER — Other Ambulatory Visit: Payer: Self-pay

## 2018-07-18 ENCOUNTER — Ambulatory Visit (INDEPENDENT_AMBULATORY_CARE_PROVIDER_SITE_OTHER): Payer: No Typology Code available for payment source | Admitting: Nurse Practitioner

## 2018-07-18 DIAGNOSIS — K625 Hemorrhage of anus and rectum: Secondary | ICD-10-CM

## 2018-07-18 DIAGNOSIS — R109 Unspecified abdominal pain: Secondary | ICD-10-CM | POA: Insufficient documentation

## 2018-07-18 DIAGNOSIS — Z86010 Personal history of colonic polyps: Secondary | ICD-10-CM

## 2018-07-18 MED ORDER — PEG 3350-KCL-NA BICARB-NACL 420 G PO SOLR: 4000.0000 mL | ORAL | 0 refills | Status: DC

## 2018-07-18 NOTE — Progress Notes (Signed)
cc'ed to pcp °

## 2018-07-18 NOTE — Assessment & Plan Note (Signed)
Self-limiting episodes of rectal bleeding.  Last episode of bleeding was approximately 3 months ago.  He denies any constipation or straining at that time.  He does have a history of colon polyps as per above.  At this point we will proceed with colonoscopy to further evaluate as he is due for routine exam anyway.  Return for follow-up based on post procedure recommendations.

## 2018-07-18 NOTE — Assessment & Plan Note (Signed)
The patient has intermittent mild abdominal pain which is near suprapubic.  He is being worked up by urology for this.  He denies any constipation or straining.  No other GI concerns.  If urology is not able to find an etiology they can consider further GI work-up.  His work-up is being handled by the [place].  Follow-up with them as they recommend.

## 2018-07-18 NOTE — Patient Instructions (Signed)
1. We will schedule your procedure for you. 2. Further recommendations will be made after your procedure. 3. Return for follow-up based on post procedure recommendations. 4. Call us if you have any questions or concerns.  At [facility] we value your feedback. You may receive a survey about your visit today. Please share your experience as we strive to create trusting relationships with our patients to provide genuine, compassionate, quality care.  It was great to meet you today!  I hope you have a great summer!!

## 2018-07-18 NOTE — Assessment & Plan Note (Signed)
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  They recommended a repeat exam in 5 years.  [facility].  He did have some rectal bleeding around the time of his last [place] appointment.  He states his last episode of rectal bleeding was about 3 months ago.  He does have some lower abdominal pain which is addressed above.  No other red flag/warning signs or symptoms.  We will proceed with colonoscopy at this time.  Proceed with TCS with Dr. Gala Romney in near future: the risks, benefits, and alternatives have been discussed with the patient in detail. The patient states understanding and desires to proceed.  Patient is not on any anticoagulants, anxiolytics, chronic pain medications, or antidepressants.  Conscious sedation should be adequate for his procedure.

## 2018-07-18 NOTE — Progress Notes (Signed)
Primary Care Physician:  [facility] Primary Gastroenterologist:  Dr. Gala Romney  Chief Complaint  Patient presents with  . Rectal Bleeding    occurred 3 months ago  . Abdominal Pain    lower abd  . Colonoscopy    consult, last tcs approx 4 yrs ago in [place]    HPI:   Darrell Young is a 67 y.o. male who presents on referral from the [facility] for scheduling of colonoscopy with complaints of rectal bleeding and abdominal pain.  Reviewed information provided with referral including office note dated 04/04/2018.  At that time they note history of polyps and rectal bleeding at that time and indicated he is due for colonoscopy.  They indicate last colonoscopy was in 2014 and due for repeat in 2019 due to adenomatous polyps.  There are apparently multiple polyps.  His procedure was last completed in [place]..  No history of colonoscopy or endoscopy in our system.  Today he states he's doing well. Last colonoscopy in [place]. Having some lower abdominal pain (near suprapubic area) and he has an appointment to see Urology coming up. Voiding without issue. Has a bowel movement 2-3 times a day, [place] 4, no straining or hard stools. Denies any other abdominal pain, N/V, melena, fever, chills, unintentional weight loss. Did have some hematochezia, last seen 3 months ago and not constipated when he was having hematochezia. No hemorrhoid symptoms or known hemorrhoids. Denies chest pain, dyspnea, dizziness, lightheadedness, syncope, near syncope. Denies any other upper or lower GI symptoms.  Past Medical History:  Diagnosis Date  . Asthma   . Hyperlipidemia   . Lung disease     Past Surgical History:  Procedure Laterality Date  . ELBOW SURGERY    . TOE AMPUTATION    . TUMOR REMOVAL     back of neck  . UMBILICAL HERNIA REPAIR      Current Outpatient Medications  Medication Sig Dispense Refill  . atorvastatin (LIPITOR) 80 MG tablet Take 80 mg by mouth at bedtime.      . budesonide-formoterol (SYMBICORT) 160-4.5 MCG/ACT inhaler Inhale 2 puffs into the lungs 2 (two) times daily.    . fluticasone (FLONASE) 50 MCG/ACT nasal spray Place 2 sprays into both nostrils daily.    Marland Kitchen OVER THE COUNTER MEDICATION Walnut oil 7 drops twice a day     No current facility-administered medications for this visit.     Allergies as of 07/18/2018 - Review Complete 07/18/2018  Allergen Reaction Noted  . Aspirin  09/30/2017    Family History  Problem Relation Age of Onset  . Colon cancer Neg Hx     Social History   Socioeconomic History  . Marital status: Married    Spouse name: Not on file  . Number of children: Not on file  . Years of education: Not on file  . Highest education level: Not on file  Occupational History  . Not on file  Social Needs  . Financial resource strain: Not on file  . Food insecurity:    Worry: Not on file    Inability: Not on file  . Transportation needs:    Medical: Not on file    Non-medical: Not on file  Tobacco Use  . Smoking status: Never Smoker  . Smokeless tobacco: Never Used  Substance and Sexual Activity  . Alcohol use: No  . Drug use: No  . Sexual activity: Not on file  Lifestyle  . Physical activity:  Days per week: Not on file    Minutes per session: Not on file  . Stress: Not on file  Relationships  . Social connections:    Talks on phone: Not on file    Gets together: Not on file    Attends religious service: Not on file    Active member of club or organization: Not on file    Attends meetings of clubs or organizations: Not on file    Relationship status: Not on file  . Intimate partner violence:    Fear of current or ex partner: Not on file    Emotionally abused: Not on file    Physically abused: Not on file    Forced sexual activity: Not on file  Other Topics Concern  . Not on file  Social History Narrative  . Not on file    Review of Systems: General: Negative for anorexia, weight loss, fever,  chills, fatigue, weakness. ENT: Negative for hoarseness, difficulty swallowing , nasal congestion. CV: Negative for chest pain, angina, palpitations, dyspnea on exertion, peripheral edema.  Respiratory: Negative for dyspnea at rest, dyspnea on exertion, cough, sputum, wheezing.  GI: See history of present illness. MS: Negative for joint pain, low back pain.  Derm: Negative for rash or itching.  Endo: Negative for unusual weight change.  Heme: Negative for bruising or bleeding. Allergy: Negative for rash or hives.    Physical Exam: BP 125/81   Pulse 73   Temp (!) 97.3 F (36.3 C) (Oral)   Ht 6\' 3"  (1.905 m)   Wt 242 lb (109.8 kg)   BMI 30.25 kg/m  General:   Alert and oriented. Pleasant and cooperative. Well-nourished and well-developed.  Eyes:  Without icterus, sclera clear and conjunctiva pink.  Ears:  Normal auditory acuity. Cardiovascular:  S1, S2 present without murmurs appreciated. Extremities without clubbing or edema. Respiratory:  Clear to auscultation bilaterally. No wheezes, rales, or rhonchi. No distress.  Gastrointestinal:  +BS, soft, and non-distended. Mild lower abdominal/suprapubic TTP noted. No HSM noted. No guarding or rebound. No masses appreciated.  Rectal:  Deferred  Musculoskalatal:  Symmetrical without gross deformities. Neurologic:  Alert and oriented x4;  grossly normal neurologically. Psych:  Alert and cooperative. Normal mood and affect. Heme/Lymph/Immune: No excessive bruising noted.    07/18/2018 8:33 AM   Disclaimer: This note was dictated with voice recognition software. Similar sounding words can inadvertently be transcribed and may not be corrected upon review.

## 2018-07-26 ENCOUNTER — Emergency Department (HOSPITAL_COMMUNITY): Payer: Non-veteran care

## 2018-07-26 ENCOUNTER — Emergency Department (HOSPITAL_COMMUNITY)
Admission: EM | Admit: 2018-07-26 | Discharge: 2018-07-26 | Disposition: A | Discharge status: Home / Self Care | Payer: Non-veteran care | Attending: Emergency Medicine | Admitting: Emergency Medicine

## 2018-07-26 ENCOUNTER — Other Ambulatory Visit: Payer: Self-pay

## 2018-07-26 ENCOUNTER — Encounter (HOSPITAL_COMMUNITY): Payer: Self-pay | Admitting: Emergency Medicine

## 2018-07-26 DIAGNOSIS — M25521 Pain in right elbow: Secondary | ICD-10-CM | POA: Diagnosis present

## 2018-07-26 DIAGNOSIS — E785 Hyperlipidemia, unspecified: Secondary | ICD-10-CM | POA: Insufficient documentation

## 2018-07-26 DIAGNOSIS — J45909 Unspecified asthma, uncomplicated: Secondary | ICD-10-CM | POA: Insufficient documentation

## 2018-07-26 DIAGNOSIS — Z79899 Other long term (current) drug therapy: Secondary | ICD-10-CM | POA: Insufficient documentation

## 2018-07-26 MED ORDER — MELOXICAM 7.5 MG PO TABS: 7.5000 mg | ORAL_TABLET | Freq: Every day | ORAL | 0 refills | Status: DC

## 2018-07-26 MED ORDER — PREDNISONE 10 MG PO TABS: ORAL_TABLET | ORAL | 0 refills | Status: DC

## 2018-07-26 NOTE — ED Triage Notes (Signed)
Pt c/o of right elbow pain that started over a week ago.

## 2018-07-26 NOTE — Discharge Instructions (Signed)
Return if any problems.

## 2018-07-27 NOTE — ED Provider Notes (Signed)
[facility] EMERGENCY DEPARTMENT Provider Note   CSN: [id number] Arrival date & time: 07/26/18  2030     History   Chief Complaint Chief Complaint  Patient presents with  . Elbow Pain    HPI Darrell Young is a 67 y.o. male.  The history is provided by the patient. No language interpreter was used.  Arm Injury   This is a recurrent problem. The problem occurs constantly. The problem has been gradually worsening. The pain is present in the right elbow. The pain is moderate. Pertinent negatives include no numbness. He has tried nothing for the symptoms. The treatment provided no relief.  Pt complains of pain in his right elbow.  Pt reports he has had bone spurs in left elbow and this feels the same.   Past Medical History:  Diagnosis Date  . Asthma   . Hyperlipidemia   . Lung disease     Patient Active Problem List   Diagnosis Date Noted  . History of colonic polyps 07/18/2018  . Rectal bleeding 07/18/2018  . Abdominal pain 07/18/2018    Past Surgical History:  Procedure Laterality Date  . ELBOW SURGERY    . TOE AMPUTATION    . TUMOR REMOVAL     back of neck  . UMBILICAL HERNIA REPAIR          Home Medications    Prior to Admission medications   Medication Sig Start Date End Date Taking? Authorizing Provider  atorvastatin (LIPITOR) 80 MG tablet Take 80 mg by mouth at bedtime.    [provider]  budesonide-formoterol (SYMBICORT) 160-4.5 MCG/ACT inhaler Inhale 2 puffs into the lungs 2 (two) times daily.    [provider]  fluticasone (FLONASE) 50 MCG/ACT nasal spray Place 2 sprays into both nostrils daily.    [provider]  meloxicam (MOBIC) 7.5 MG tablet Take 1 tablet (7.5 mg total) by mouth daily. 07/26/18   Fransico Meadow, PA-C  OVER THE COUNTER MEDICATION Walnut oil 7 drops twice a day    [provider]  polyethylene glycol-electrolytes (TRILYTE) 420 g solution Take 4,000 mLs by mouth as directed. 07/18/18   Daneil Dolin, MD  predniSONE (DELTASONE) 10 MG tablet 6,5,4,3,2,1 taper 07/26/18   Fransico Meadow, PA-C    Family History Family History  Problem Relation Age of Onset  . Colon cancer Neg Hx     Social History Social History   Tobacco Use  . Smoking status: Never Smoker  . Smokeless tobacco: Never Used  Substance Use Topics  . Alcohol use: No  . Drug use: No     Allergies   Aspirin   Review of Systems Review of Systems  Neurological: Negative for numbness.  All other systems reviewed and are negative.    Physical Exam Updated Vital Signs BP 127/71 (BP Location: Left Arm)   Pulse 76   Temp (!) 97.4 F (36.3 C) (Temporal)   Resp 16   Ht 6\' 3"  (1.905 m)   Wt 108 kg (238 lb)   SpO2 95%   BMI 29.75 kg/m   Physical Exam  Constitutional: He appears well-developed and well-nourished.  HENT:  Head: Normocephalic.  Musculoskeletal: He exhibits tenderness.  Right elbow, swollen  Pain with movement,  nv and ns intact  Neurological: He is alert.  Skin: Skin is warm.  Psychiatric: He has a normal mood and affect.  Nursing note and vitals reviewed.    ED Treatments / Results  Labs (all labs ordered  are listed, but only abnormal results are displayed) Labs Reviewed - No data to display  EKG None  Radiology Dg Elbow Complete Right  Result Date: 07/26/2018 CLINICAL DATA:  67 year old with one-week history of RIGHT elbow pain and limited range of motion. No known injuries. EXAM: RIGHT ELBOW - COMPLETE 3+ VIEW COMPARISON:  None. FINDINGS: No evidence of acute or subacute fracture or dislocation. Mild narrowing of the joint spaces between the humerus and ulna and the humerus and radius. Large enthesopathic spur at the insertion of the triceps tendon on the olecranon. Spurring adjacent to the MEDIAL epicondyle. Well-preserved bone mineral density. No POSTERIOR fat pad to indicate a joint effusion or hemarthrosis. IMPRESSION: 1. Mild osteoarthritis. 2. Extra-axial DISH.  Electronically Signed   By: Evangeline Dakin M.D.   On: 07/26/2018 21:07    Procedures Procedures (including critical care time)  Medications Ordered in ED Medications - No data to display   Initial Impression / Assessment and Plan / ED Course  I have reviewed the triage vital signs and the nursing notes.  Pertinent labs & imaging results that were available during my care of the patient were reviewed by me and considered in my medical decision making (see chart for details).     Pt advised on results.  Pt placed in a sling.  Pt advised to follow up with Dr. Aline Brochure for further evaluation and treatment.   Final Clinical Impressions(s) / ED Diagnoses   Final diagnoses:  Elbow pain, right    ED Discharge Orders        Ordered    predniSONE (DELTASONE) 10 MG tablet  Status:  Discontinued     07/26/18 2127    meloxicam (MOBIC) 7.5 MG tablet  Daily     07/26/18 2127    predniSONE (DELTASONE) 10 MG tablet     07/26/18 2130       Fransico Meadow, [place] 07/27/18 0015    Fredia Sorrow, MD 07/31/18 [phone]

## 2018-08-19 ENCOUNTER — Ambulatory Visit (INDEPENDENT_AMBULATORY_CARE_PROVIDER_SITE_OTHER): Payer: Medicare Other | Admitting: Orthopedic Surgery

## 2018-08-19 ENCOUNTER — Encounter: Payer: Self-pay | Admitting: Orthopedic Surgery

## 2018-08-19 VITALS — BP 118/77 | HR 76 | Ht 75.0 in | Wt 236.0 lb

## 2018-08-19 DIAGNOSIS — M19021 Primary osteoarthritis, right elbow: Secondary | ICD-10-CM | POA: Diagnosis not present

## 2018-08-19 NOTE — Progress Notes (Signed)
NEW PATIENT OFFICE VISI  Chief Complaint  Patient presents with  . 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  He presents with lateral elbow pain loss of motion with dull throbbing aching pain along the lateral epicondyles between it and the olecranon which seems to be worse with exercise improved with prednisone.  No catching or locking.  He would like these spurs removed  His x-ray shows osteoarthritis and then a large olecranon spur   Review of Systems  Constitutional: Negative for fever.  Musculoskeletal: Positive for joint pain.  Skin: Negative.   Neurological: Negative for tingling and weakness.     Past Medical History:  Diagnosis Date  . Asthma   . Hyperlipidemia   . Lung disease     Past Surgical History:  Procedure Laterality Date  . ELBOW SURGERY Left    patient states 124 bone spurs removed  . TOE AMPUTATION    . TUMOR REMOVAL     back of neck  . UMBILICAL HERNIA REPAIR      Family History  Problem Relation Age of Onset  . Diabetes Mother   . Heart attack Mother   . Stroke Mother   . Colon cancer Neg Hx    Social History   Tobacco Use  . Smoking status: Never Smoker  . Smokeless tobacco: Never Used  Substance Use Topics  . Alcohol use: No  . Drug use: No    Allergies  Allergen Reactions  . Aspirin     GI upset    Current Meds  Medication Sig  . atorvastatin (LIPITOR) 80 MG tablet Take 80 mg by mouth at bedtime.  . fluticasone (FLONASE) 50 MCG/ACT nasal spray Place 2 sprays into both nostrils daily.  Marland Kitchen OVER THE COUNTER MEDICATION Walnut oil 7 drops twice a day    BP 118/77   Pulse 76   Ht 6\' 3"  (1.905 m)   Wt 236 lb (107 kg)   BMI 29.50 kg/m   Physical Exam  Constitutional: He is oriented to person, place, and time. He appears well-developed and well-nourished.  Vital signs  have been reviewed and are stable. Gen. appearance the patient is well-developed and well-nourished with normal grooming and hygiene.   Neurological: He is alert and oriented to person, place, and time.  Skin: Skin is warm and dry. No erythema.  Psychiatric: He has a normal mood and affect.  Vitals reviewed.   Right Elbow Exam   Tenderness  Right elbow tenderness location: Tenderness was specifically over the lateral epicondyles between it and the olecranon there was no tenderness over the olecranon itself.   Range of Motion  Extension: 10  Right elbow flexion: 112.   Muscle Strength  The patient has normal right elbow strength.  Tests  Varus: negative Valgus: negative Tinel's sign (cubital tunnel): negative  Other  Erythema: absent Scars: present Sensation: normal Pulse: present   Left Elbow Exam   Tenderness  The patient is experiencing no tenderness.   Range of Motion  Extension: 0  Left elbow flexion: 125.   Muscle Strength  The patient has normal left elbow strength.  Tests  Varus: negative Valgus: negative Tinel's sign (cubital tunnel): negative  Other  Erythema: absent Scars: present Sensation: normal Pulse: present        MEDICAL DECISION SECTION  Xrays were done at [facility]  CLINICAL DATA:  67 year old with one-week history of RIGHT elbow pain and limited range of motion. No known injuries.   EXAM: RIGHT ELBOW - COMPLETE 3+ VIEW   COMPARISON:  None.   FINDINGS: No evidence of acute or subacute fracture or dislocation. Mild narrowing of the joint spaces between the humerus and ulna and the humerus and radius. Large enthesopathic spur at the insertion of the triceps tendon on the olecranon. Spurring adjacent to the MEDIAL epicondyle. Well-preserved bone mineral density. No POSTERIOR fat pad to indicate a joint effusion or hemarthrosis.   IMPRESSION: 1. Mild osteoarthritis. 2. Extra-axial DISH.     Electronically Signed   By:  Evangeline Dakin M.D.   On: 07/26/2018 21:07    My independent reading of xrays:  4 view right elbow mild to moderate osteoarthritis with large olecranon spur as well  Encounter Diagnosis  Name Primary?  Marland Kitchen Arthritis of right elbow Yes    PLAN: (Rx., injectx, surgery, frx, mri/ct) I made a referral for him for evaluation and treatment possible removal of spurs open versus arthroscopic versus physical therapy and continued NSAID management  No orders of the defined types were placed in this encounter.   Arther Abbott, MD  08/19/2018 10:55 AM

## 2018-08-19 NOTE — Patient Instructions (Signed)
ELBOW ARTHRITIS   Options for treatment are  1. Physical therapy and nsaids ( anti inflammatory medication)   2. Surgery

## 2018-08-27 ENCOUNTER — Other Ambulatory Visit: Payer: Self-pay

## 2018-08-27 ENCOUNTER — Encounter (HOSPITAL_COMMUNITY)
Admission: RE | Disposition: A | Discharge status: Home / Self Care | Payer: Self-pay | Source: Ambulatory Visit | Attending: Internal Medicine

## 2018-08-27 ENCOUNTER — Ambulatory Visit (HOSPITAL_COMMUNITY)
Admission: RE | Admit: 2018-08-27 | Discharge: 2018-08-27 | Disposition: A | Discharge status: Home / Self Care | Payer: No Typology Code available for payment source | Source: Ambulatory Visit | Attending: Internal Medicine | Admitting: Internal Medicine

## 2018-08-27 ENCOUNTER — Encounter (HOSPITAL_COMMUNITY): Payer: Self-pay

## 2018-08-27 DIAGNOSIS — J984 Other disorders of lung: Secondary | ICD-10-CM | POA: Insufficient documentation

## 2018-08-27 DIAGNOSIS — Z1211 Encounter for screening for malignant neoplasm of colon: Secondary | ICD-10-CM | POA: Diagnosis present

## 2018-08-27 DIAGNOSIS — K621 Rectal polyp: Secondary | ICD-10-CM | POA: Insufficient documentation

## 2018-08-27 DIAGNOSIS — K625 Hemorrhage of anus and rectum: Secondary | ICD-10-CM

## 2018-08-27 DIAGNOSIS — Z86010 Personal history of colonic polyps: Secondary | ICD-10-CM | POA: Insufficient documentation

## 2018-08-27 DIAGNOSIS — J45909 Unspecified asthma, uncomplicated: Secondary | ICD-10-CM | POA: Diagnosis not present

## 2018-08-27 DIAGNOSIS — Z7951 Long term (current) use of inhaled steroids: Secondary | ICD-10-CM | POA: Diagnosis not present

## 2018-08-27 DIAGNOSIS — E785 Hyperlipidemia, unspecified: Secondary | ICD-10-CM | POA: Diagnosis not present

## 2018-08-27 HISTORY — PX: COLONOSCOPY: SHX5424

## 2018-08-27 HISTORY — PX: POLYPECTOMY: SHX5525

## 2018-08-27 SURGERY — COLONOSCOPY
Anesthesia: Moderate Sedation

## 2018-08-27 MED ORDER — MIDAZOLAM HCL 5 MG/5ML IJ SOLN
INTRAMUSCULAR | Status: AC
  Filled 2018-08-27: qty 5

## 2018-08-27 MED ORDER — SODIUM CHLORIDE 0.9 % IV SOLN
INTRAVENOUS | Status: DC
  Administered 2018-08-27: 07:00:00 via INTRAVENOUS

## 2018-08-27 MED ORDER — MEPERIDINE HCL 100 MG/ML IJ SOLN
INTRAMUSCULAR | Status: DC | PRN
  Administered 2018-08-27: 25 mg

## 2018-08-27 MED ORDER — ONDANSETRON HCL 4 MG/2ML IJ SOLN
INTRAMUSCULAR | Status: DC | PRN
  Administered 2018-08-27: 4 mg via INTRAVENOUS

## 2018-08-27 MED ORDER — ONDANSETRON HCL 4 MG/2ML IJ SOLN
INTRAMUSCULAR | Status: AC
  Filled 2018-08-27: qty 2

## 2018-08-27 MED ORDER — MEPERIDINE HCL 50 MG/ML IJ SOLN
INTRAMUSCULAR | Status: AC
  Filled 2018-08-27: qty 1

## 2018-08-27 MED ORDER — MIDAZOLAM HCL 5 MG/5ML IJ SOLN
INTRAMUSCULAR | Status: DC | PRN
  Administered 2018-08-27: 2 mg via INTRAVENOUS
  Administered 2018-08-27: 1 mg via INTRAVENOUS
  Administered 2018-08-27: 2 mg via INTRAVENOUS

## 2018-08-27 MED ORDER — STERILE WATER FOR IRRIGATION IR SOLN
Status: DC | PRN
  Administered 2018-08-27: 08:00:00

## 2018-08-27 NOTE — Op Note (Signed)
[facility] Patient Name: Darrell Young Procedure Date: 08/27/2018 7:01 AM MRN: [id number] Date of Birth: September 23, 1951 Attending MD: Norvel Richards , MD CSN: [id number] Age: 67 Admit Type: Outpatient Procedure:                Colonoscopy Indications:              High risk colon cancer surveillance: Personal                            history of colonic polyps Providers:                Norvel Richards, MD, Janeece Riggers, RN, Aram Candela Referring MD:              Medicines:                Midazolam 5 mg IV, Meperidine 25 mg IV, Ondansetron                            4 mg IV Complications:            No immediate complications. Estimated Blood Loss:     Estimated blood loss was minimal. Procedure:                Pre-Anesthesia Assessment:                           - Prior to the procedure, a History and Physical                            was performed, and patient medications and                            allergies were reviewed. The patient's tolerance of                            previous anesthesia was also reviewed. The risks                            and benefits of the procedure and the sedation                            options and risks were discussed with the patient.                            All questions were answered, and informed consent                            was obtained. Prior Anticoagulants: The patient has                            taken no previous anticoagulant or antiplatelet  agents. ASA Grade Assessment: II - A patient with                            mild systemic disease. After reviewing the risks                            and benefits, the patient was deemed in                            satisfactory condition to undergo the procedure.                           After obtaining informed consent, the colonoscope                            was passed under direct vision. Throughout the                             procedure, the patient's blood pressure, pulse, and                            oxygen saturations were monitored continuously. The                            [id number]-[id number] ([id number]) scope was introduced through                            the anus and advanced to the the cecum, identified                            by appendiceal orifice and ileocecal valve. The                            colonoscopy was performed without difficulty. The                            patient tolerated the procedure well. The quality                            of the bowel preparation was adequate. Scope In: 7:47:24 AM Scope Out: 8:07:38 AM Scope Withdrawal Time: 0 hours 11 minutes 35 seconds  Total Procedure Duration: 0 hours 20 minutes 14 seconds  Findings:      The perianal and digital rectal examinations were normal.      Two sessile polyps were found in the rectum and rectum (benign-appearing       lesion). The polyps were 4 to 5 mm in size. These polyps were removed       with a cold snare. Resection and retrieval were complete. Estimated       blood loss was minimal.      The exam was otherwise without abnormality on direct and retroflexion       views. Impression:               - Two benign appearing 4 to 5 mm polyps in the  rectum and in the rectum, removed with a cold                            snare. Resected and retrieved.                           - The examination was otherwise normal on direct                            and retroflexion views. Moderate Sedation:      Moderate (conscious) sedation was administered by the endoscopy nurse       and supervised by the endoscopist. The following parameters were       monitored: oxygen saturation, heart rate, blood pressure, respiratory       rate, EKG, adequacy of pulmonary ventilation, and response to care.       Total physician intraservice time was 23 minutes. Recommendation:           - Patient  has a contact number available for                            emergencies. The signs and symptoms of potential                            delayed complications were discussed with the                            patient. Return to normal activities tomorrow.                            Written discharge instructions were provided to the                            patient.                           - Resume previous diet.                           - Continue present medications.                           - Repeat colonoscopy date to be determined after                            pending pathology results are reviewed for                            surveillance based on pathology results.                           - Return to GI office (date not yet determined). Procedure Code(s):        --- Professional ---                           [phone], Colonoscopy, flexible; with removal of  tumor(s), polyp(s), or other lesion(s) by snare                            technique                           G0500, Moderate sedation services provided by the                            same physician or other qualified health care                            professional performing a gastrointestinal                            endoscopic service that sedation supports,                            requiring the presence of an independent trained                            observer to assist in the monitoring of the                            patient's level of consciousness and physiological                            status; initial 15 minutes of intra-service time;                            patient age 107 years or older (additional time may                            be reported with [phone], as appropriate)                           [phone], Moderate sedation services provided by the                            same physician or other qualified health care                             professional performing the diagnostic or                            therapeutic service that the sedation supports,                            requiring the presence of an independent trained                            observer to assist in the monitoring of the                            patient's  level of consciousness and physiological                            status; each additional 15 minutes intraservice                            time (List separately in addition to code for                            primary service) Diagnosis Code(s):        --- Professional ---                           Z86.010, Personal history of colonic polyps                           K62.1, Rectal polyp CPT copyright 2017 [employer]. All rights reserved. The codes documented in this report are preliminary and upon coder review may  be revised to meet current compliance requirements. Cristopher Estimable. Rourk, MD Norvel Richards, MD 08/27/2018 8:12:34 AM This report has been signed electronically. Number of Addenda: 0

## 2018-08-27 NOTE — Discharge Instructions (Signed)
°Colonoscopy °Discharge Instructions ° °Read the instructions outlined below and refer to this sheet in the next few weeks. These discharge instructions provide you with general information on caring for yourself after you leave the hospital. Your doctor may also give you specific instructions. While your treatment has been planned according to the most current medical practices available, unavoidable complications occasionally occur. If you have any problems or questions after discharge, call Dr. Rourk at [phone]. °ACTIVITY °· You may resume your regular activity, but move at a slower pace for the next 24 hours.  °· Take frequent rest periods for the next 24 hours.  °· Walking will help get rid of the air and reduce the bloated feeling in your belly (abdomen).  °· No driving for 24 hours (because of the medicine (anesthesia) used during the test).   °· Do not sign any important legal documents or operate any machinery for 24 hours (because of the anesthesia used during the test).  °NUTRITION °· Drink plenty of fluids.  °· You may resume your normal diet as instructed by your doctor.  °· Begin with a light meal and progress to your normal diet. Heavy or fried foods are harder to digest and may make you feel sick to your stomach (nauseated).  °· Avoid alcoholic beverages for 24 hours or as instructed.  °MEDICATIONS °· You may resume your normal medications unless your doctor tells you otherwise.  °WHAT YOU CAN EXPECT TODAY °· Some feelings of bloating in the abdomen.  °· Passage of more gas than usual.  °· Spotting of blood in your stool or on the toilet paper.  °IF YOU HAD POLYPS REMOVED DURING THE COLONOSCOPY: °· No aspirin products for 7 days or as instructed.  °· No alcohol for 7 days or as instructed.  °· Eat a soft diet for the next 24 hours.  °FINDING OUT THE RESULTS OF YOUR TEST °Not all test results are available during your visit. If your test results are not back during the visit, make an appointment  with your caregiver to find out the results. Do not assume everything is normal if you have not heard from your caregiver or the medical facility. It is important for you to follow up on all of your test results.  °SEEK IMMEDIATE MEDICAL ATTENTION IF: °· You have more than a spotting of blood in your stool.  °· Your belly is swollen (abdominal distention).  °· You are nauseated or vomiting.  °· You have a temperature over 101.  °· You have abdominal pain or discomfort that is severe or gets worse throughout the day.  ° ° °Colon polyp information provided ° °Further recommendations to follow pending review of pathology report. ° ° °Colon Polyps °Polyps are tissue growths inside the body. Polyps can grow in many places, including the large intestine (colon). A polyp may be a round bump or a mushroom-shaped growth. You could have one polyp or several. °Most colon polyps are noncancerous (benign). However, some colon polyps can become cancerous over time. °What are the causes? °The exact cause of colon polyps is not known. °What increases the risk? °This condition is more likely to develop in people who: °· Have a family history of colon cancer or colon polyps. °· Are older than 50 or older than 45 if they are African American. °· Have inflammatory bowel disease, such as ulcerative colitis or Crohn disease. °· Are overweight. °· Smoke cigarettes. °· Do not get enough exercise. °· Drink too much alcohol. °·   Eat a diet that is: °? High in fat and red meat. °? Low in fiber. °· Had childhood cancer that was treated with abdominal radiation. ° °What are the signs or symptoms? °Most polyps do not cause symptoms. If you have symptoms, they may include: °· Blood coming from your rectum when having a bowel movement. °· Blood in your stool. The stool may look dark red or black. °· A change in bowel habits, such as constipation or diarrhea. ° °How is this diagnosed? °This condition is diagnosed with a colonoscopy. This is a  procedure that uses a lighted, flexible scope to look at the inside of your colon. °How is this treated? °Treatment for this condition involves removing any polyps that are found. Those polyps will then be tested for cancer. If cancer is found, your health care provider will talk to you about options for colon cancer treatment. °Follow these instructions at home: °Diet °· Eat plenty of fiber, such as fruits, vegetables, and whole grains. °· Eat foods that are high in calcium and vitamin D, such as milk, cheese, yogurt, eggs, liver, fish, and broccoli. °· Limit foods high in fat, red meats, and processed meats, such as hot dogs, sausage, bacon, and lunch meats. °· Maintain a healthy weight, or lose weight if recommended by your health care provider. °General instructions °· Do not smoke cigarettes. °· Do not drink alcohol excessively. °· Keep all follow-up visits as told by your health care provider. This is important. This includes keeping regularly scheduled colonoscopies. Talk to your health care provider about when you need a colonoscopy. °· Exercise every day or as told by your health care provider. °Contact a health care provider if: °· You have new or worsening bleeding during a bowel movement. °· You have new or increased blood in your stool. °· You have a change in bowel habits. °· You unexpectedly lose weight. °This information is not intended to replace advice given to you by your health care provider. Make sure you discuss any questions you have with your health care provider. °Document Released: 09/06/2004 Document Revised: 05/18/2016 Document Reviewed: 11/01/2015 °Elsevier Interactive Patient Education © 2018 [employer] ° °

## 2018-08-27 NOTE — H&P (Signed)
@LOGO @   Primary Care Physician:  [facility] Primary Gastroenterologist:  Dr. Gala Romney  Pre-Procedure History & Physical: HPI:  Darrell Young is a 67 y.o. male here for surveillance colonoscopy. History of colonic adenomas removed previously.  Past Medical History:  Diagnosis Date  . Asthma   . Hyperlipidemia   . Lung disease     Past Surgical History:  Procedure Laterality Date  . ELBOW SURGERY Left    patient states 124 bone spurs removed  . TOE AMPUTATION    . TUMOR REMOVAL     back of neck  . UMBILICAL HERNIA REPAIR      Prior to Admission medications   Medication Sig Start Date End Date Taking? Authorizing Provider  atorvastatin (LIPITOR) 80 MG tablet Take 80 mg by mouth at bedtime.   Yes [provider]  budesonide-formoterol (SYMBICORT) 80-4.5 MCG/ACT inhaler Inhale 2 puffs into the lungs 2 (two) times daily.    Yes [provider]  fluticasone (FLONASE) 50 MCG/ACT nasal spray Place 1 spray into both nostrils 2 (two) times daily.    Yes [provider]  OVER THE COUNTER MEDICATION Take 15 mLs by mouth 2 (two) times daily. [place] oil   Yes [provider]  polyethylene glycol-electrolytes (TRILYTE) 420 g solution Take 4,000 mLs by mouth as directed. 07/18/18  Yes Agapita Savarino, Cristopher Estimable, MD  meloxicam (MOBIC) 7.5 MG tablet Take 1 tablet (7.5 mg total) by mouth daily. Patient not taking: Reported on 08/19/2018 07/26/18   Fransico Meadow, PA-C    Allergies as of 07/18/2018 - Review Complete 07/18/2018  Allergen Reaction Noted  . Aspirin  09/30/2017    Family History  Problem Relation Age of Onset  . Diabetes Mother   . Heart attack Mother   . Stroke Mother   . Colon cancer Neg Hx     Social History   Socioeconomic History  . Marital status: Married    Spouse name: Not on file  . Number of children: Not on file  . Years of education: Not on file  . Highest education level: Not on file  Occupational History  . Not on  file  Social Needs  . Financial resource strain: Not on file  . Food insecurity:    Worry: Not on file    Inability: Not on file  . Transportation needs:    Medical: Not on file    Non-medical: Not on file  Tobacco Use  . Smoking status: Never Smoker  . Smokeless tobacco: Never Used  Substance and Sexual Activity  . Alcohol use: No  . Drug use: No  . Sexual activity: Not on file  Lifestyle  . Physical activity:    Days per week: Not on file    Minutes per session: Not on file  . Stress: Not on file  Relationships  . Social connections:    Talks on phone: Not on file    Gets together: Not on file    Attends religious service: Not on file    Active member of club or organization: Not on file    Attends meetings of clubs or organizations: Not on file    Relationship status: Not on file  . Intimate partner violence:    Fear of current or ex partner: Not on file    Emotionally abused: Not on file    Physically abused: Not on file    Forced sexual activity: Not on file  Other Topics Concern  . Not on  file  Social History Narrative  . Not on file    Review of Systems: See HPI, otherwise negative ROS  Physical Exam: BP (!) 143/94   Pulse 64   Temp 98.7 F (37.1 C) (Oral)   Resp 17   SpO2 98%  General:   Alert,  Well-developed, well-nourished, pleasant and cooperative in NAD SNeck:  Supple; no masses or thyromegaly. No significant cervical adenopathy. Lungs:  Clear throughout to auscultation.   No wheezes, crackles, or rhonchi. No acute distress. Heart:  Regular rate and rhythm; no murmurs, clicks, rubs,  or gallops. Abdomen: Non-distended, normal bowel sounds.  Soft and nontender without appreciable mass or hepatosplenomegaly.  Pulses:  Normal pulses noted. Extremities:  Without clubbing or edema.  Impression/Plan: 67 year old here for surveillance colonoscopy. No GI symptoms at this time.  The risks, benefits, limitations, alternatives and imponderables have been  reviewed with the patient. Questions have been answered. All parties are agreeable.     Notice: This dictation was prepared with Dragon dictation along with smaller phrase technology. Any transcriptional errors that result from this process are unintentional and may not be corrected upon review.

## 2018-08-28 ENCOUNTER — Encounter: Payer: Self-pay | Admitting: Internal Medicine

## 2018-08-29 ENCOUNTER — Encounter (HOSPITAL_COMMUNITY): Payer: Self-pay | Admitting: Internal Medicine

## 2018-10-21 ENCOUNTER — Ambulatory Visit: Payer: Medicare Other | Admitting: Orthopedic Surgery

## 2019-08-26 ENCOUNTER — Other Ambulatory Visit: Payer: Self-pay

## 2019-08-26 ENCOUNTER — Ambulatory Visit
Admission: EM | Admit: 2019-08-26 | Discharge: 2019-08-26 | Disposition: A | Discharge status: Home / Self Care | Payer: No Typology Code available for payment source

## 2019-08-26 ENCOUNTER — Ambulatory Visit (INDEPENDENT_AMBULATORY_CARE_PROVIDER_SITE_OTHER): Payer: Medicare Other

## 2019-08-26 DIAGNOSIS — R0789 Other chest pain: Secondary | ICD-10-CM

## 2019-08-26 DIAGNOSIS — R05 Cough: Secondary | ICD-10-CM

## 2019-08-26 DIAGNOSIS — J4521 Mild intermittent asthma with (acute) exacerbation: Secondary | ICD-10-CM

## 2019-08-26 DIAGNOSIS — R053 Chronic cough: Secondary | ICD-10-CM

## 2019-08-26 MED ORDER — PREDNISONE 20 MG PO TABS: 20.0000 mg | ORAL_TABLET | Freq: Two times a day (BID) | ORAL | 0 refills | Status: AC

## 2019-08-26 MED ORDER — BENZONATATE 100 MG PO CAPS: 100.0000 mg | ORAL_CAPSULE | Freq: Three times a day (TID) | ORAL | 0 refills | Status: DC

## 2019-08-26 NOTE — ED Triage Notes (Signed)
Pt has cough for past month, [facility]

## 2019-08-26 NOTE — Discharge Instructions (Signed)
Continue with inhaler as prescribed X-ray did not show concerning features for pneumonia We will treat you for possible asthma flare Prednisone prescribed.  Take as directed and to completion Follow up with PCP next week for recheck and to ensure your symptoms are improving Return here or go to ER if you have any new or worsening symptoms such as shortness of breath, difficulty breathing, accessory muscle use, rib retraction, if symptoms do not improve with medication, etc..Marland Kitchen

## 2019-08-26 NOTE — ED Provider Notes (Signed)
[place]   [id number] 08/26/19 Arrival Time: 62  Cc: COUGH  SUBJECTIVE:  Darrell Young is a 68 y.o. male hx significant for asthma, and HLD, who presents with cough and mild chest tightness x 1 month. Denies sick exposure to COVID, flu or strep.  Denies recent travel.   Describes cough as intermittent and productive with clear sputum.  Has tried mucinex for the past 3 weeks with relief.  Symptoms are made worse at night.  Denies fever, chills, fatigue, night sweats, weight loss, sinus pain, rhinorrhea, sore throat, SOB, wheezing, chest pain, nausea, changes in bowel or bladder habits.    Denies past or current tobacco or alcohol use.    Uses CPAP at night.    ROS: As per HPI.  All other pertinent ROS negative.     Past Medical History:  Diagnosis Date  . Asthma   . Hyperlipidemia   . Lung disease    Past Surgical History:  Procedure Laterality Date  . COLONOSCOPY N/A 08/27/2018   Procedure: COLONOSCOPY;  Surgeon: Daneil Dolin, MD;  [facility];  Service: Endoscopy;  Laterality: N/A;  7:30am  . ELBOW SURGERY Left    patient states 124 bone spurs removed  . POLYPECTOMY  08/27/2018   Procedure: POLYPECTOMY;  Surgeon: Daneil Dolin, MD;  Location: [facility];  Service: Endoscopy;;  rexctal polyp times 2 cs  . TOE AMPUTATION    . TUMOR REMOVAL     back of neck  . UMBILICAL HERNIA REPAIR     Allergies  Allergen Reactions  . Aspirin     GI upset   No current facility-administered medications on file prior to encounter.    Current Outpatient Medications on File Prior to Encounter  Medication Sig Dispense Refill  . atorvastatin (LIPITOR) 80 MG tablet Take 80 mg by mouth at bedtime.    . budesonide-formoterol (SYMBICORT) 80-4.5 MCG/ACT inhaler Inhale 2 puffs into the lungs 2 (two) times daily.     . fluticasone (FLONASE) 50 MCG/ACT nasal spray Place 1 spray into both nostrils 2 (two) times daily.     Marland Kitchen OVER THE COUNTER MEDICATION Take 15 mLs by  mouth 2 (two) times daily. [place] oil    . polyethylene glycol-electrolytes (TRILYTE) 420 g solution Take 4,000 mLs by mouth as directed. 4000 mL 0  . SYMBICORT 160-4.5 MCG/ACT inhaler       Social History   Socioeconomic History  . Marital status: Married    Spouse name: Not on file  . Number of children: Not on file  . Years of education: Not on file  . Highest education level: Not on file  Occupational History  . Not on file  Social Needs  . Financial resource strain: Not on file  . Food insecurity    Worry: Not on file    Inability: Not on file  . Transportation needs    Medical: Not on file    Non-medical: Not on file  Tobacco Use  . Smoking status: Never Smoker  . Smokeless tobacco: Never Used  Substance and Sexual Activity  . Alcohol use: No  . Drug use: No  . Sexual activity: Not on file  Lifestyle  . Physical activity    Days per week: Not on file    Minutes per session: Not on file  . Stress: Not on file  Relationships  . Social Herbalist on phone: Not on file    Gets together: Not on  file    Attends religious service: Not on file    Active member of club or organization: Not on file    Attends meetings of clubs or organizations: Not on file    Relationship status: Not on file  . Intimate partner violence    Fear of current or ex partner: Not on file    Emotionally abused: Not on file    Physically abused: Not on file    Forced sexual activity: Not on file  Other Topics Concern  . Not on file  Social History Narrative  . Not on file   Family History  Problem Relation Age of Onset  . Diabetes Mother   . Heart attack Mother   . Stroke Mother   . Colon cancer Neg Hx      OBJECTIVE:  Vitals:   08/26/19 1055  BP: (!) 142/78  Pulse: 78  Resp: 18  Temp: 98.1 F (36.7 C)  SpO2: 96%     General appearance: Alert, well-appearing nontoxic; speaking in full sentences without difficulty HEENT:NCAT; Ears: EACs clear, TMs pearly gray;  Eyes: PERRL.  EOM grossly intact. Nose: nares patent without rhinorrhea; Throat: tonsils nonerythematous or enlarged, uvula midline  Neck: supple without LAD Lungs: clear to auscultation bilaterally without adventitious breath sounds; normal respiratory effort; cough absent Heart: regular rate and rhythm.  Radial pulses 2+ symmetrical bilaterally Skin: warm and dry Psychological: alert and cooperative; normal mood and affect  DIAGNOSTIC STUDIES:  Dg Chest 2 View  Result Date: 08/26/2019 CLINICAL DATA:  Chronic cough EXAM: CHEST - 2 VIEW COMPARISON:  02/27/2016 FINDINGS: The heart size and mediastinal contours are stable. No focal airspace consolidation, pleural effusion, or pneumothorax. There is preservation of the thoracic spine disc spaces with flowing syndesmophytes suggesting DISH. IMPRESSION: No active cardiopulmonary disease. Electronically Signed   By: Davina Poke M.D.   On: 08/26/2019 11:42     X-rays negative for infiltrate, PE, or obvious nodules.    I have reviewed the x-rays myself and the radiologist interpretation. I am in agreement with the radiologist interpretation.     ASSESSMENT & PLAN:  1. Chronic cough   2. Mild intermittent asthma with acute exacerbation     Meds ordered this encounter  Medications  . predniSONE (DELTASONE) 20 MG tablet    Sig: Take 1 tablet (20 mg total) by mouth 2 (two) times daily with a meal for 5 days.    Dispense:  10 tablet    Refill:  0    Order Specific Question:   Supervising Provider    Answer:   Raylene Everts [id number]  . benzonatate (TESSALON) 100 MG capsule    Sig: Take 1 capsule (100 mg total) by mouth every 8 (eight) hours.    Dispense:  21 capsule    Refill:  0    Order Specific Question:   Supervising Provider    Answer:   Raylene Everts [id number]    Orders Placed This Encounter  Procedures  . DG Chest 2 View    Standing Status:   Standing    Number of Occurrences:   1    Order Specific Question:    Reason for Exam (SYMPTOM  OR DIAGNOSIS REQUIRED)    Answer:   chronic cough    Continue with inhaler as prescribed X-ray did not show concerning features for pneumonia We will treat you for possible asthma flare Prednisone prescribed.  Take as directed and to completion Follow up with PCP next week  for recheck and to ensure your symptoms are improving Return here or go to ER if you have any new or worsening symptoms such as shortness of breath, difficulty breathing, accessory muscle use, rib retraction, if symptoms do not improve with medication, etc...  Reviewed expectations re: course of current medical issues. Questions answered. Outlined signs and symptoms indicating need for more acute intervention. Patient verbalized understanding. After Visit Summary given.          Lestine Box, PA-C 08/26/19 1154

## 2019-12-29 ENCOUNTER — Other Ambulatory Visit: Payer: Self-pay

## 2019-12-29 ENCOUNTER — Emergency Department (HOSPITAL_COMMUNITY)
Admission: EM | Admit: 2019-12-29 | Discharge: 2019-12-29 | Disposition: A | Discharge status: Home / Self Care | Payer: No Typology Code available for payment source | Attending: Emergency Medicine | Admitting: Emergency Medicine

## 2019-12-29 DIAGNOSIS — R053 Chronic cough: Secondary | ICD-10-CM

## 2019-12-29 DIAGNOSIS — J45909 Unspecified asthma, uncomplicated: Secondary | ICD-10-CM | POA: Diagnosis not present

## 2019-12-29 DIAGNOSIS — Z79899 Other long term (current) drug therapy: Secondary | ICD-10-CM | POA: Diagnosis not present

## 2019-12-29 DIAGNOSIS — R05 Cough: Secondary | ICD-10-CM

## 2019-12-29 MED ORDER — OMEPRAZOLE 20 MG PO CPDR: 20.0000 mg | DELAYED_RELEASE_CAPSULE | Freq: Every day | ORAL | 0 refills | Status: DC

## 2019-12-29 NOTE — ED Triage Notes (Signed)
Pt reports intermittent productive cough x8 months. Pt reports otc medication with no relief. Pt denies cp, shortness of breath. nad noted.

## 2019-12-29 NOTE — Discharge Instructions (Signed)
Try Omeprazole once daily for possible reflux. Take this daily for 2 weeks to see if there is any improvement in your cough Please follow up with Dr. Redmond Baseman with ENT for further evaluation

## 2019-12-29 NOTE — ED Provider Notes (Signed)
[facility] EMERGENCY DEPARTMENT Provider Note   CSN: [id number] Arrival date & time: 12/29/19  1009     History Chief Complaint  Patient presents with  . Cough    Darrell Young is a 69 y.o. male with history of asthma who presents with a cough.  Patient states that he has had a chronic cough for approximately 1 year.  He states that sometimes it is dry and sometimes is productive of clear mucus.  Seems to be worse when he is on the pulpit and talking real fast and he will have a coughing spell.  He also seems to cough a lot at night and states that him and his wife not been able to get sleep because of the cough.  He has tried multiple over-the-counter medicines and prescription meds as well occluding Mucinex, Vicks, allergy medicine, his inhalers, and prednisone.  He feels like something may be stuck in his throat.  He typically goes to the [place] but was not able to get in with them quickly so decided to come to the ED today.  He denies recent fever or illness, chest pain, shortness of breath, wheezing, postnasal drip, sore throat or heartburn symptoms.  He is not on any blood pressure medications.  HPI     Past Medical History:  Diagnosis Date  . Asthma   . Hyperlipidemia   . Lung disease     Patient Active Problem List   Diagnosis Date Noted  . History of colonic polyps 07/18/2018  . Rectal bleeding 07/18/2018  . Abdominal pain 07/18/2018    Past Surgical History:  Procedure Laterality Date  . COLONOSCOPY N/A 08/27/2018   Procedure: COLONOSCOPY;  Surgeon: Daneil Dolin, MD;  [facility];  Service: Endoscopy;  Laterality: N/A;  7:30am  . ELBOW SURGERY Left    patient states 124 bone spurs removed  . POLYPECTOMY  08/27/2018   Procedure: POLYPECTOMY;  Surgeon: Daneil Dolin, MD;  [facility];  Service: Endoscopy;;  rexctal polyp times 2 cs  . TOE AMPUTATION    . TUMOR REMOVAL     back of neck  . UMBILICAL HERNIA REPAIR         Family History   Problem Relation Age of Onset  . Diabetes Mother   . Heart attack Mother   . Stroke Mother   . Colon cancer Neg Hx     Social History   Tobacco Use  . Smoking status: Never Smoker  . Smokeless tobacco: Never Used  Substance Use Topics  . Alcohol use: No  . Drug use: No    Home Medications Prior to Admission medications   Medication Sig Start Date End Date Taking? Authorizing Provider  atorvastatin (LIPITOR) 80 MG tablet Take 80 mg by mouth at bedtime.    [provider]  benzonatate (TESSALON) 100 MG capsule Take 1 capsule (100 mg total) by mouth every 8 (eight) hours. 08/26/19   Wurst, [place], PA-C  budesonide-formoterol (SYMBICORT) 80-4.5 MCG/ACT inhaler Inhale 2 puffs into the lungs 2 (two) times daily.     [provider]  fluticasone (FLONASE) 50 MCG/ACT nasal spray Place 1 spray into both nostrils 2 (two) times daily.     [provider]  OVER THE COUNTER MEDICATION Take 15 mLs by mouth 2 (two) times daily. [place] oil    [provider]  polyethylene glycol-electrolytes (TRILYTE) 420 g solution Take 4,000 mLs by mouth as directed. 07/18/18   Rourk, Cristopher Estimable, MD  SYMBICORT 160-4.5 MCG/ACT inhaler  03/26/19   [provider]    Allergies    Aspirin  Review of Systems   Review of Systems  Constitutional: Negative for fever.  HENT: Negative for ear pain, postnasal drip, rhinorrhea, sinus pressure, sneezing, sore throat, trouble swallowing and voice change.   Respiratory: Positive for cough. Negative for shortness of breath and wheezing.   Cardiovascular: Negative for chest pain.  Psychiatric/Behavioral: Positive for sleep disturbance.    Physical Exam Updated Vital Signs BP 113/68 (BP Location: Right Arm)   Pulse 83   Temp 97.9 F (36.6 C) (Oral)   Resp 16   Ht 6\' 2"  (1.88 m)   Wt 106.6 kg   SpO2 99%   BMI 30.17 kg/m   Physical Exam Vitals and nursing note reviewed.  Constitutional:      General: He is not in acute  distress.    Appearance: Normal appearance. He is well-developed. He is not ill-appearing.     Comments: Well appearing male in NAD  HENT:     Head: Normocephalic and atraumatic.     Right Ear: Tympanic membrane normal.     Left Ear: Tympanic membrane normal.     Nose: Nose normal.     Mouth/Throat:     Mouth: Mucous membranes are moist.  Eyes:     General: No scleral icterus.       Right eye: No discharge.        Left eye: No discharge.     Conjunctiva/sclera: Conjunctivae normal.     Pupils: Pupils are equal, round, and reactive to light.  Cardiovascular:     Rate and Rhythm: Normal rate and regular rhythm.  Pulmonary:     Effort: Pulmonary effort is normal. No respiratory distress.     Breath sounds: Normal breath sounds.  Abdominal:     General: There is no distension.  Musculoskeletal:     Cervical back: Normal range of motion.  Skin:    General: Skin is warm and dry.  Neurological:     Mental Status: He is alert and oriented to person, place, and time.  Psychiatric:        Behavior: Behavior normal.     ED Results / Procedures / Treatments   Labs (all labs ordered are listed, but only abnormal results are displayed) Labs Reviewed - No data to display  EKG None  Radiology No results found.  Procedures Procedures (including critical care time)  Medications Ordered in ED Medications - No data to display  ED Course  I have reviewed the triage vital signs and the nursing notes.  Pertinent labs & imaging results that were available during my care of the patient were reviewed by me and considered in my medical decision making (see chart for details).  69 year old male presents with chronic cough. DDx: allergies, asthma, GERD, infection. He is not a smoker and denies any recent illness. He is not on an ACE inhibitory. He feels like something is stuck in his throat and asking for imaging of his throat today.  His vitals are normal he is very well-appearing.  His  exam is unremarkable.  He has tried multiple different meds for his symptoms none of which have been helping him.  At this point, he has not tried anything for acid reflux so we can try that although it is unclear if this is the problem or not.  We will refer him to ENT for further evaluation since he has not seen  a specialist yet and he is agreeable to this.  He has had several chest x-rays which have all been negative and I do not feel we need to repeat this today.  MDM Rules/Calculators/A&P                      Final Clinical Impression(s) / ED Diagnoses Final diagnoses:  Chronic cough    Rx / DC Orders ED Discharge Orders    None       Recardo Evangelist, PA-C 12/29/19 1243    Virgel Manifold, MD 12/30/19 [phone]

## 2020-01-21 ENCOUNTER — Ambulatory Visit: Payer: Medicare Other | Attending: Internal Medicine

## 2020-01-21 ENCOUNTER — Other Ambulatory Visit: Payer: Self-pay

## 2020-01-21 DIAGNOSIS — Z20822 Contact with and (suspected) exposure to covid-19: Secondary | ICD-10-CM

## 2020-01-22 LAB — NOVEL CORONAVIRUS, NAA: SARS-CoV-2, NAA: DETECTED — AB

## 2020-01-23 ENCOUNTER — Telehealth: Payer: Self-pay | Admitting: Nurse Practitioner

## 2020-01-23 NOTE — Telephone Encounter (Signed)
Called to Discuss with patient about Covid symptoms and the use of bamlanivimab, a monoclonal antibody infusion for those with mild to moderate Covid symptoms and at a high risk of hospitalization.     Pt states that symptoms started 10 days ago.

## 2020-06-12 ENCOUNTER — Encounter (HOSPITAL_COMMUNITY): Payer: Self-pay | Admitting: *Deleted

## 2020-06-12 ENCOUNTER — Emergency Department (HOSPITAL_COMMUNITY): Payer: No Typology Code available for payment source

## 2020-06-12 ENCOUNTER — Emergency Department (HOSPITAL_COMMUNITY)
Admission: EM | Admit: 2020-06-12 | Discharge: 2020-06-12 | Disposition: A | Discharge status: Home / Self Care | Payer: No Typology Code available for payment source | Attending: Emergency Medicine | Admitting: Emergency Medicine

## 2020-06-12 ENCOUNTER — Other Ambulatory Visit: Payer: Self-pay

## 2020-06-12 DIAGNOSIS — G5622 Lesion of ulnar nerve, left upper limb: Secondary | ICD-10-CM | POA: Insufficient documentation

## 2020-06-12 DIAGNOSIS — W228XXA Striking against or struck by other objects, initial encounter: Secondary | ICD-10-CM | POA: Insufficient documentation

## 2020-06-12 DIAGNOSIS — M25522 Pain in left elbow: Secondary | ICD-10-CM | POA: Diagnosis present

## 2020-06-12 DIAGNOSIS — Y999 Unspecified external cause status: Secondary | ICD-10-CM | POA: Insufficient documentation

## 2020-06-12 DIAGNOSIS — Y939 Activity, unspecified: Secondary | ICD-10-CM | POA: Diagnosis not present

## 2020-06-12 DIAGNOSIS — J449 Chronic obstructive pulmonary disease, unspecified: Secondary | ICD-10-CM | POA: Diagnosis not present

## 2020-06-12 DIAGNOSIS — Y929 Unspecified place or not applicable: Secondary | ICD-10-CM | POA: Diagnosis not present

## 2020-06-12 HISTORY — DX: Chronic cough: R05.3

## 2020-06-12 HISTORY — DX: Chronic obstructive pulmonary disease, unspecified: J44.9

## 2020-06-12 HISTORY — DX: Chronic sinusitis, unspecified: J32.9

## 2020-06-12 HISTORY — DX: Heart disease, unspecified: I51.9

## 2020-06-12 MED ORDER — METHYLPREDNISOLONE 4 MG PO TBPK: ORAL_TABLET | ORAL | 0 refills | Status: DC

## 2020-06-12 MED ORDER — IBUPROFEN 600 MG PO TABS: 600.0000 mg | ORAL_TABLET | Freq: Four times a day (QID) | ORAL | 0 refills | Status: DC | PRN

## 2020-06-12 MED ORDER — ACETAMINOPHEN 325 MG PO TABS: 650.0000 mg | ORAL_TABLET | Freq: Four times a day (QID) | ORAL | 0 refills | Status: AC | PRN

## 2020-06-12 NOTE — ED Triage Notes (Signed)
Pt c/o numbness, feeling cold and pain in left elbow ever since he hit it on the door of the car while trying to get in a couple of days ago.

## 2020-06-12 NOTE — Discharge Instructions (Addendum)
You may have an ulnar neuropathy, or pinched nerve at the elbow.  Most of these issues resolve on their own in a few weeks.  We talked about trying to keep your elbow straight and wrapping a towel around it at night while sleeping.  Try to avoid flexing or bending the elbow too much.  We also talked about starting steroids for a few days.  You can also take motrin 600 mg every 6 hours, and tylenol 650 mg every 6 hours, alternating so you are taking one or the other every 3 hours at home.  Call to make an appointment with our neurologist in 3-4 weeks.  If you are still having pain, you can be seen in the office.  If you have improved you do not need to see a neurologist.  Note - ibuprofen (motrin) can cause stomach upset.  Always take this with a small bit of food.  If it upsets your stomach, stop taking this medicine.

## 2020-06-12 NOTE — ED Provider Notes (Signed)
[place] Provider Note   CSN: [id number] Arrival date & time: 06/12/20  5465     History Chief Complaint  Patient presents with  . Elbow Injury    Darrell Young is a 69 y.o. male presented to emergency department with trauma to his left elbow and neuropathy in his hand.  The patient reports that he struck his left elbow directly on the door of a car 4 days ago.  He had pain immediately in the left elbow, but radiating up his forearm towards his left hand.  He continues to have significant pain in that region.  He describes some paresthesias in his hand.  Nothing has been helping with his pain.  No other trauma reported.  HPI     Past Medical History:  Diagnosis Date  . Asthma   . Chronic cough   . Chronic sinusitis   . COPD (chronic obstructive pulmonary disease) ([place])   . 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 Hyperlipidemia   . Lung disease     Patient Active Problem List   Diagnosis Date Noted  . History of colonic polyps 07/18/2018  . Rectal bleeding 07/18/2018  . Abdominal pain 07/18/2018    Past Surgical History:  Procedure Laterality Date  . COLONOSCOPY N/A 08/27/2018   Procedure: COLONOSCOPY;  Surgeon: Daneil Dolin, MD;  [facility];  Service: Endoscopy;  Laterality: N/A;  7:30am  . ELBOW SURGERY Bilateral    patient states 124 bone spurs removed  . POLYPECTOMY  08/27/2018   Procedure: POLYPECTOMY;  Surgeon: Daneil Dolin, MD;  Location: [facility];  Service: Endoscopy;;  rexctal polyp times 2 cs  . ROTATOR CUFF REPAIR Right   . TOE AMPUTATION    . TUMOR REMOVAL     back of neck  . UMBILICAL HERNIA REPAIR         Family History  Problem Relation Age of Onset  . Diabetes Mother   . Heart attack Mother   . Stroke Mother   . Colon cancer Neg Hx     Social History   Tobacco Use  . Smoking status: Never Smoker  . Smokeless tobacco: Never Used  Vaping Use  . Vaping  Use: Never used  Substance Use Topics  . Alcohol use: No  . Drug use: No    Home Medications Prior to Admission medications   Medication Sig Start Date End Date Taking? Authorizing Provider  acetaminophen (TYLENOL) 325 MG tablet Take 2 tablets (650 mg total) by mouth every 6 (six) hours as needed for up to 30 doses for mild pain or moderate pain. 06/12/20   Wyvonnia Dusky, MD  atorvastatin (LIPITOR) 80 MG tablet Take 80 mg by mouth at bedtime.    [provider]  benzonatate (TESSALON) 100 MG capsule Take 1 capsule (100 mg total) by mouth every 8 (eight) hours. 08/26/19   Wurst, [place], PA-C  budesonide-formoterol (SYMBICORT) 80-4.5 MCG/ACT inhaler Inhale 2 puffs into the lungs 2 (two) times daily.     [provider]  fluticasone (FLONASE) 50 MCG/ACT nasal spray Place 1 spray into both nostrils 2 (two) times daily.     [provider]  ibuprofen (ADVIL) 600 MG tablet Take 1 tablet (600 mg total) by mouth every 6 (six) hours as needed for up to 30 doses. 06/12/20   Wyvonnia Dusky, MD  methylPREDNISolone (MEDROL DOSEPAK) 4 MG TBPK tablet Use  as directed on package 06/12/20   Wyvonnia Dusky, MD  omeprazole (PRILOSEC) 20 MG capsule Take 1 capsule (20 mg total) by mouth daily. 12/29/19   Recardo Evangelist, PA-C  OVER THE COUNTER MEDICATION Take 15 mLs by mouth 2 (two) times daily. [place] oil    [provider]  polyethylene glycol-electrolytes (TRILYTE) 420 g solution Take 4,000 mLs by mouth as directed. 07/18/18   Daneil Dolin, MD  SYMBICORT 160-4.5 MCG/ACT inhaler  03/26/19   [provider]    Allergies    Aspirin  Review of Systems   Review of Systems  Constitutional: Negative for chills and fever.  Respiratory: Negative for cough and shortness of breath.   Cardiovascular: Negative for chest pain and palpitations.  Genitourinary: Negative for dysuria and hematuria.  Musculoskeletal: Positive for arthralgias and joint swelling. Negative  for neck pain and neck stiffness.  Skin: Negative for rash and wound.  Allergic/Immunologic: Negative for food allergies and immunocompromised state.  Neurological: Positive for weakness and numbness. Negative for syncope and light-headedness.  Psychiatric/Behavioral: Negative for agitation and confusion.  All other systems reviewed and are negative.   Physical Exam Updated Vital Signs BP (!) 149/86 (BP Location: Right Arm)   Pulse 65   Temp 98.4 F (36.9 C) (Oral)   Resp 17   Ht 6\' 2"  (1.88 m)   Wt 104.3 kg   SpO2 98%   BMI 29.53 kg/m   Physical Exam Vitals and nursing note reviewed.  Constitutional:      Appearance: He is well-developed. He is not ill-appearing.  HENT:     Head: Normocephalic and atraumatic.  Eyes:     Conjunctiva/sclera: Conjunctivae normal.  Cardiovascular:     Rate and Rhythm: Normal rate and regular rhythm.     Heart sounds: No murmur heard.   Pulmonary:     Effort: Pulmonary effort is normal. No respiratory distress.     Breath sounds: Normal breath sounds.  Musculoskeletal:     Cervical back: Neck supple.     Comments: Tenderness over the left olecranon, no visible deformity, no overlying warmth or effusion  Skin:    General: Skin is warm and dry.  Neurological:     Mental Status: He is alert.     Comments: LUE: Pain with flexion/extension at the left elbow, pain with supination Full ROM at shoulder and elbow. No pain with flexion / extension / abduction / adduction at shoulder. Left wrist non-tender. No snuffbox tenderness. No pain with axial loading of L thumb. No superficial tenderness (skin) of L palm. No tenderness of dorsal metacarpal bones. Intact opposition, finger abduction, and wrist flexion / extension.      ED Results / Procedures / Treatments   Labs (all labs ordered are listed, but only abnormal results are displayed) Labs Reviewed - No data to display  EKG None  Radiology DG Elbow Complete Left  Result Date:  06/12/2020 CLINICAL DATA:  69 year old male with a history of elbow trauma EXAM: LEFT ELBOW - COMPLETE 3+ VIEW COMPARISON:  None. FINDINGS: No acute displaced fracture. Advanced degenerative changes the elbow joint. No disruption of the fat planes with no evidence of joint effusion. No radiopaque foreign body. IMPRESSION: Negative for acute bony abnormality. Degenerative changes. Electronically Signed   By: Corrie Mckusick D.O.   On: 06/12/2020 10:02    Procedures Procedures (including critical care time)  Medications Ordered in ED Medications - No data to display  ED Course  I have reviewed  the triage vital signs and the nursing notes.  Pertinent labs & imaging results that were available during my care of the patient were reviewed by me and considered in my medical decision making (see chart for details).  69 year old male present emergency department with trauma to left elbow, direct impact 4 days ago.  Now having paresthesias in his left forearm.  Significant tenderness to left elbow without any visible deformities.  No sign of joint infection on exam.  We will get an x-ray of the elbow here.  He is otherwise neurovascularly intact in his distal arm.  He reports paresthesias but has no anesthesia.  His motor function is intact with radial, ulnar, and median nerves as noted on exam.   Suspect ulnar neuropathy  Clinical Course as of Jun 13 1735  Sat Jun 12, 2020  1011 FINDINGS: No acute displaced fracture. Advanced degenerative changes the elbow joint. No disruption of the fat planes with no evidence of joint effusion. No radiopaque foreign body.    [MT]  7494 Discussed x-ray images with the patient and his daughter.  Believe this is an ulnar neuropathy.  Discussed management including trying to avoid hyperflexing his elbow, wrap a towel on his elbow at night while sleeping, short course of steroids might be reasonable.  I also advised ibuprofen and Tylenol for several days.  I will give  my neurologist office number to set up an appointment about a month.  If he has not improved by then he will have follow-up ready   [MT]    Clinical Course User Index [MT] Dai Mcadams, Carola Rhine, MD    Final Clinical Impression(s) / ED Diagnoses Final diagnoses:  Ulnar neuropathy of left upper extremity    Rx / DC Orders ED Discharge Orders         Ordered    methylPREDNISolone (MEDROL DOSEPAK) 4 MG TBPK tablet     Discontinue  Reprint     06/12/20 1058    ibuprofen (ADVIL) 600 MG tablet  Every 6 hours PRN     Discontinue  Reprint     06/12/20 1058    acetaminophen (TYLENOL) 325 MG tablet  Every 6 hours PRN     Discontinue  Reprint     06/12/20 1058           Wyvonnia Dusky, MD 06/12/20 1737

## 2021-01-04 ENCOUNTER — Other Ambulatory Visit: Payer: Self-pay

## 2021-01-04 ENCOUNTER — Encounter (HOSPITAL_COMMUNITY): Payer: Self-pay | Admitting: Speech Pathology

## 2021-01-04 ENCOUNTER — Ambulatory Visit (HOSPITAL_COMMUNITY): Payer: No Typology Code available for payment source | Attending: Nurse Practitioner | Admitting: Speech Pathology

## 2021-01-04 DIAGNOSIS — R1311 Dysphagia, oral phase: Secondary | ICD-10-CM | POA: Insufficient documentation

## 2021-01-04 NOTE — Therapy (Signed)
[place], [place], [address] Phone: [phone]   Fax:  [phone]  Speech Language Pathology Evaluation/Clinical Swallow  Patient Details  Name: Darrell Young MRN: [id number] Date of Birth: 22-Nov-1951 No data recorded  Encounter Date: 01/04/2021   End of Session - 01/04/21 1449    Visit Number 1    Number of Visits 1    Authorization Type VA (approved for 15 visits)    SLP Start Time [id number]    SLP Stop Time  1510    SLP Time Calculation (min) 48 min    Activity Tolerance Patient tolerated treatment well           Past Medical History:  Diagnosis Date  . Asthma   . Chronic cough   . Chronic sinusitis   . COPD (chronic obstructive pulmonary disease) ([place])   . 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 Hyperlipidemia   . Lung disease     Past Surgical History:  Procedure Laterality Date  . COLONOSCOPY N/A 08/27/2018   Procedure: COLONOSCOPY;  Surgeon: Daneil Dolin, MD;  [facility];  Service: Endoscopy;  Laterality: N/A;  7:30am  . ELBOW SURGERY Bilateral    patient states 124 bone spurs removed  . POLYPECTOMY  08/27/2018   Procedure: POLYPECTOMY;  Surgeon: Daneil Dolin, MD;  [facility];  Service: Endoscopy;;  rexctal polyp times 2 cs  . ROTATOR CUFF REPAIR Right   . TOE AMPUTATION    . TUMOR REMOVAL     back of neck  . UMBILICAL HERNIA REPAIR      There were no vitals filed for this visit.   Subjective Assessment - 01/04/21 1439    Subjective "I've had a cough for about two years."    Currently in Pain? No/denies             Prior Functional Status - 01/04/21 1441      Prior Functional Status   Cognitive/Linguistic Baseline Within functional limits    Type of Home House     Lives With Noralee Stain Retired           General - 01/04/21 1442      General Information   Date of Onset 12/29/20    HPI Darrell Young is a 70 year old male who was referred by [name], NP for a clinical swallow evaluation. Pt reports a history of a chronic cough x 2 years. He indicates that he coughs when he eats dry foods and when having conversations during meals. He had an Upper GI series February 2021 which was essentially normal. He tells me that he saw an ENT, but cannot recall details or when it was. He had COVID in January 2021 with limited symptoms and has been vaccinated and boosted.    Type of Study Bedside Swallow Evaluation    Previous Swallow Assessment UGI 01/2020 reportedly normal    Diet Prior to this Study Regular;Thin liquids    Temperature Spikes Noted No    Respiratory Status Room air    History of Recent Intubation No    Behavior/Cognition Alert;Cooperative;Pleasant mood    Oral Cavity Assessment Within Functional Limits    Oral Care Completed by SLP No    Oral Cavity - Dentition Adequate natural dentition    Vision Functional for self-feeding    Self-Feeding Abilities Able to feed  self    Patient Positioning Upright in chair    Baseline Vocal Quality Normal    Volitional Cough Strong    Volitional Swallow Able to elicit            Oral Motor/Sensory Function - 01/04/21 1447      Oral Motor/Sensory Function   Overall Oral Motor/Sensory Function Within functional limits           Ice Chips - 01/04/21 1447      Ice Chips   Ice chips Not tested           Thin Liquid - 01/04/21 1448      Thin Liquid   Thin Liquid Within functional limits    Presentation Cup;Self Fed           Nectar thick liquid - 01/04/21 1448      Nectar Thick Liquid   Nectar Thick Liquid Not tested            Puree - 01/04/21 1448      Puree   Puree Within functional limits    Presentation Self Fed;Spoon           Solid - 01/04/21 1448      Solid   Solid Within functional limits    Presentation Self Fed                 SLP Short Term Goals - 01/04/21 1545      SLP  SHORT TERM GOAL #1   Title N/A              Plan - 01/04/21 1529    Clinical Impression Statement Pt presents to SLP with reports of chronic cough and increased coughing with dry solids. Clinical swallow evaluation completed and reveals WNL oral phase swallow and suspected WNL pharyngeal phase of swallow. Oral motor examination is WNL. Pt assessed with water, puree, and regular textures and Pt without overt signs or symptoms of aspiration. Pt reported feeling a "tickle" in his throat and pointed to sternal notch after consuming graham crackers. Pt's symptoms appear more consistent with reflux in setting of an individual with asthma. He was administered the Reflux Symptom Index and The Eating Assessment Tool (EAT-10) with results below. Pt was given written strategies regarding reflux precautions. He already elevates the head of his bed, has limited intake of carbonated beverages, avoids reclining after meals, and exercises regularly. He does indicate that he consumes fried, fatty foods at times. He was encouraged to keep a symptom log to track if certain foods or activities increase his coughing episodes. I do not think a modified barium swallow study (MBSS) is indicated at this time and he would benefit more from ENT and/or GI consult, however we can complete MBSS here if MD would like (need order).   EATING ASSESSMENT TOOL (EAT-10)   The patient was asked to rate to what extent the following statements are problematic on a scale of 0-4. 0 = No problem; 4 = Severe problem. A total score of 3 or higher is considered abnormal. My swallowing problem has caused me to lose weight. 0  My swallowing problem interferes with my ability to go out for meals. 2  Swallowing liquids takes extra effort. 0 Swallowing solids takes extra effort. 2  Swallowing pills takes extra effort. 0 Swallowing is painful. 0  The pleasure of eating is affected by my swallowing. 3  When I swallow food sticks in my throat. 4   I cough when  I eat. 4 Swallowing is stressful. 1  TOTAL SCORE: 16 [X]  Abnormal (raw score >3) []  Normal    Reflux Symptom Index Hoarseness or a problem with your voice 3 Clearing your throat 2 Excess throat mucous or postnasal drip 5 Difficulty swallowing food, liquids, or pills 3 Coughing after you eat or after lying down 3 Breathing difficulties or choking episodes 4 Troublesome or annoying cough 4 Sensation of something sticking in your throat or a lump in your throat 5 Heartburn, chest pain, indigestion, or stomach acid coming up 0 TOTAL SCORE: 29 . Normative data suggests that an RSI of greater than or equal to 13 is clinically significant . Therefore, an RSI > 13 may be indicative of significant reflux disease.   Consulted and Agree with Plan of Care Patient           Patient will benefit from skilled therapeutic intervention in order to improve the following deficits and impairments:   Dysphagia, oral phase    Problem List Patient Active Problem List   Diagnosis Date Noted  . History of colonic polyps 07/18/2018  . Rectal bleeding 07/18/2018  . Abdominal pain 07/18/2018   Thank you,  Genene Churn, [facility] 01/04/2021, 3:47 PM  [place], [place], [address] Phone: [phone]   Fax:  [phone]  Name: Nehan Flaum MRN: [id number] Date of Birth: 07/08/1951

## 2021-03-01 ENCOUNTER — Other Ambulatory Visit: Payer: Self-pay

## 2021-03-01 ENCOUNTER — Encounter (HOSPITAL_COMMUNITY): Payer: Self-pay | Admitting: Physical Therapy

## 2021-03-01 ENCOUNTER — Ambulatory Visit (HOSPITAL_COMMUNITY): Payer: No Typology Code available for payment source | Attending: Nurse Practitioner | Admitting: Physical Therapy

## 2021-03-01 DIAGNOSIS — R1311 Dysphagia, oral phase: Secondary | ICD-10-CM | POA: Diagnosis present

## 2021-03-01 DIAGNOSIS — M6281 Muscle weakness (generalized): Secondary | ICD-10-CM | POA: Insufficient documentation

## 2021-03-01 DIAGNOSIS — R2689 Other abnormalities of gait and mobility: Secondary | ICD-10-CM | POA: Insufficient documentation

## 2021-03-01 NOTE — Therapy (Signed)
[place], [place], [address] Phone: [phone]   Fax:  [phone]  Physical Therapy Evaluation  Patient Details  Name: Darrell Young MRN: [id number] Date of Birth: [birth year] Referring Provider (PT): [name] NP   Encounter Date: 03/01/2021   PT End of Session - 03/01/21 0913    Visit Number 1    Number of Visits 12    Date for PT Re-Evaluation 04/12/21    Authorization Type Primary: [facility] Secondary: Medicare    Authorization - Visit Number 1    Authorization - Number of Visits 15    Progress Note Due on Visit 10    PT Start Time 0829    PT Stop Time 0910    PT Time Calculation (min) 41 min    Activity Tolerance Patient tolerated treatment well    Behavior During Therapy [facility] for tasks assessed/performed           Past Medical History:  Diagnosis Date  . Asthma   . Chronic cough   . Chronic sinusitis   . COPD (chronic obstructive pulmonary disease) ([place])   . 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 Hyperlipidemia   . Lung disease     Past Surgical History:  Procedure Laterality Date  . COLONOSCOPY N/A 08/27/2018   Procedure: COLONOSCOPY;  Surgeon: Daneil Dolin, MD;  [facility];  Service: Endoscopy;  Laterality: N/A;  7:30am  . ELBOW SURGERY Bilateral    patient states 124 bone spurs removed  . POLYPECTOMY  08/27/2018   Procedure: POLYPECTOMY;  Surgeon: Daneil Dolin, MD;  [facility];  Service: Endoscopy;;  rexctal polyp times 2 cs  . ROTATOR CUFF REPAIR Right   . TOE AMPUTATION    . TUMOR REMOVAL     back of neck  . UMBILICAL HERNIA REPAIR      There were no vitals filed for this visit.    Subjective Assessment - 03/01/21 0829    Subjective Patient is a 70 y.o. male who presents to physical therapy with c/o muscle weakness. Patient states he has a lot of stiffness in his legs. Patient has most trouble with  walking. He had right small toe amputated several years ago. He stumbles a lot. Patient tries not to go up stairs often because of strength and balance issues. He states gradual worsening of things since retiring from [employer]. in 2010. His body wouldn't let him continue.    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 Yes    Pain Score 7     Pain Location Back    Pain Orientation Lower    Pain Descriptors / Indicators Sharp    Pain Type Acute pain    Pain Onset 1 to 4 weeks ago    Pain Frequency Constant    Multiple Pain Sites Yes    Pain Score 6    Pain Location Knee    Pain Orientation Right;Left    Pain Descriptors / Indicators Aching    Pain Type Chronic pain    Pain Onset More than a month ago    Pain Frequency Constant  [facility] PT Assessment - 03/01/21 0001      Assessment   Medical Diagnosis Muscle Weakness    Referring Provider (PT) [name] NP    Onset Date/Surgical Date 02/24/09    Next MD Visit in a couple months    Prior Therapy Yes for weakness and balance      Precautions   Precautions Fall      Restrictions   Weight Bearing Restrictions No      Balance Screen   Has the patient fallen in the past 6 months Yes    How many times? 2    Has the patient had a decrease in activity level because of a fear of falling?  No    Is the patient reluctant to leave their home because of a fear of falling?  No      Prior Function   Level of Independence Independent with household mobility with device;Needs assistance with ADLs;Needs assistance with homemaking    Vocation Retired      Associate Professor   Overall Cognitive Status Within Functional Limits for tasks assessed      Observation/Other Assessments    Observations Ambulates with SPC, unsteady    Focus on Therapeutic Outcomes (FOTO)  n/a      Sensation   Light Touch Appears Intact      ROM / Strength   AROM / PROM / Strength Strength;AROM      AROM   Overall AROM Comments WFL, lacks TKE on R      Strength   Overall Strength Comments knee pain with knee testing    Strength Assessment Site Hip;Knee;Ankle    Right/Left Hip Right;Left    Right Hip Flexion 4/5   pain in back   Left Hip Flexion 4/5   pain in back   Right/Left Knee Right;Left    Right Knee Flexion 4+/5    Right Knee Extension 4+/5    Left Knee Flexion 5/5    Left Knee Extension 5/5    Right/Left Ankle Right;Left    Right Ankle Dorsiflexion 5/5    Left Ankle Dorsiflexion 5/5      Transfers   Five time sit to stand comments  20.75 seconds without UE use, uses momentum      Ambulation/Gait   Ambulation/Gait Yes    Ambulation/Gait Assistance 6: Modified independent (Device/Increase time)    Ambulation Distance (Feet) 350 Feet    Assistive device Straight cane    Gait Pattern Right flexed knee in stance    Ambulation Surface Level;Indoor    Gait velocity decreased    Gait Comments 2MWT      Standardized Balance Assessment   Standardized Balance Assessment Dynamic Gait Index      Dynamic Gait Index   Level Surface Mild Impairment    Change in Gait Speed Mild Impairment    Gait with Horizontal Head Turns Mild Impairment    Gait with Vertical Head Turns Mild Impairment    Gait and Pivot Turn Mild Impairment    Step Over Obstacle Moderate Impairment    Step Around Obstacles Mild Impairment    Steps Moderate Impairment    Total Score 14    DGI comment: knee pain throughout                      Objective measurements completed on examination: See above findings.               PT Education - 03/01/21 6503  Education Details Patient educated on exam findings, POC, scope of PT, HEP    Person(s) Educated Patient    Methods  Explanation;Demonstration;Handout    Comprehension Verbalized understanding;Returned demonstration            PT Short Term Goals - 03/01/21 6568      PT SHORT TERM GOAL #1   Title Patient will be independent with HEP in order to improve functional outcomes.    Time 3    Period Weeks    Status New    Target Date 03/22/21      PT SHORT TERM GOAL #2   Title Patient will report at least 25% improvement in symptoms for improved quality of life.    Time 3    Period Weeks    Status New    Target Date 03/22/21             PT Long Term Goals - 03/01/21 0923      PT LONG TERM GOAL #1   Title Patient will report at least 75% improvement in symptoms for improved quality of life.    Time 6    Period Weeks    Status New    Target Date 04/12/21      PT LONG TERM GOAL #2   Title Patient will be able to complete 5x STS in under 11.4 seconds in order to reduce the risk of falls.    Time 6    Period Weeks    Status New    Target Date 04/12/21      PT LONG TERM GOAL #3   Title Patient will be able to score 19/24 with DGI in order to indicate improved dyanmic balance for ADL.    Time 6    Period Weeks    Status New    Target Date 04/12/21      PT LONG TERM GOAL #4   Title Patient will be able to ambulate at least 400 feet in 2MWT in order to demonstrate improved gait speed for community ambulation.    Time 6    Period Weeks    Status New    Target Date 04/12/21                  Plan - 03/01/21 0914    Clinical Impression Statement Patient is a 70 y.o. male who presents to physical therapy with c/o muscle weakness and gait/balance deficits. He presents with pain limited deficits in bilateral LE strength, ROM, endurance, gait, balance, and functional mobility with ADL. He is having to modify and restrict ADL as indicated by DGI score, 5xSTS, and 2MWT as well as subjective information and objective measures which is affecting overall participation. Patient will benefit  from skilled physical therapy in order to improve function and reduce impairment.    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, cervical fusion    Examination-Activity Limitations Bathing;Locomotion Level;Transfers;Squat;Stand;Stairs;Lift;Hygiene/Grooming;Dressing;Carry;Caring for Others    Examination-Participation Restrictions Meal Prep;Cleaning;Community Activity;Shop;Volunteer;Yard Work    Merchant [employer] officer Stable/Uncomplicated    Designer, jewellery Low    Rehab Potential Fair    PT Frequency 2x / week    PT Duration 6 weeks    PT Treatment/Interventions ADLs/Self Care Home Management;Aquatic Therapy;Electrical Stimulation;Cryotherapy;Iontophoresis 4mg /ml Dexamethasone;Moist Heat;Traction;Ultrasound;DME Instruction;Gait training;Stair training;Functional mobility training;Therapeutic activities;Therapeutic exercise;Balance training;Neuromuscular re-education;Patient/family education;Orthotic Fit/Training;Manual techniques;Compression bandaging;Dry needling;Energy conservation;Passive range  of motion;Taping;Splinting;Spinal Manipulations;Joint Manipulations    PT Next Visit Plan continue LE strengthening and progress to standing/functional strengthening as able. begin balance training and trial gait training with SPC in LUE    PT Home Exercise Plan 3/8 marching, LAQ, HR/TR    Consulted and Agree with Plan of Care Patient           Patient will benefit from skilled therapeutic intervention in order to improve the following deficits and impairments:  Abnormal gait,Difficulty walking,Decreased range of motion,Decreased endurance,Decreased activity tolerance,Pain,Decreased balance,Improper body mechanics,Decreased mobility,Decreased strength  Visit  Diagnosis: Other abnormalities of gait and mobility  Muscle weakness (generalized)     Problem List Patient Active Problem List   Diagnosis Date Noted  . History of colonic polyps 07/18/2018  . Rectal bleeding 07/18/2018  . Abdominal pain 07/18/2018    9:26 AM, 03/01/21 Mearl Latin PT, DPT Physical Therapist at [place], [place], [address] Phone: [phone]   Fax:  [phone]  Name: Darrell Young MRN: [id number] Date of Birth: 1951-09-29

## 2021-03-01 NOTE — Patient Instructions (Signed)
Access Code: [email or web address] URL: [email or web address] Date: 03/01/2021 Prepared by: Margie Billet  Exercises Seated March - 4 x daily - 7 x weekly - 2 sets - 10 reps Seated Long Arc Quad - 4 x daily - 7 x weekly - 2 sets - 10 reps - 5 second hold Seated Heel Toe Raises - 4 x daily - 7 x weekly - 2 sets - 10 reps

## 2021-03-04 ENCOUNTER — Other Ambulatory Visit: Payer: Self-pay

## 2021-03-04 ENCOUNTER — Ambulatory Visit (HOSPITAL_COMMUNITY): Payer: No Typology Code available for payment source | Admitting: Physical Therapy

## 2021-03-04 DIAGNOSIS — M6281 Muscle weakness (generalized): Secondary | ICD-10-CM

## 2021-03-04 DIAGNOSIS — R2689 Other abnormalities of gait and mobility: Secondary | ICD-10-CM | POA: Diagnosis not present

## 2021-03-04 NOTE — Patient Instructions (Signed)
Functional Quadriceps: Sit to Stand    Sit on edge of chair, feet flat on floor. Stand upright, extending knees fully. Repeat __5__ times per set. Do __2__ sets per session. Do __2__ sessions per day.  http://orth.exer.us/734   Copyright  VHI. All rights reserved.

## 2021-03-04 NOTE — Therapy (Addendum)
[place], [place], [address] Phone: [phone]   Fax:  [phone]  Physical Therapy Treatment  Patient Details  Name: Darrell Young MRN: [id number] Date of Birth: 21-Aug-1951 Referring Provider (PT): [name] NP   Encounter Date: 03/04/2021   PT End of Session - 03/04/21 1014    Visit Number 2    Number of Visits 12    Date for PT Re-Evaluation 04/12/21    Authorization Type Primary: [facility] Secondary: Medicare    Authorization - Visit Number 2    Authorization - Number of Visits 15    Progress Note Due on Visit 10    PT Start Time [phone]    PT Stop Time 0916    PT Time Calculation (min) 42 min    Activity Tolerance Patient tolerated treatment well    Behavior During Therapy [facility] for tasks assessed/performed           Past Medical History:  Diagnosis Date  . Asthma   . Chronic cough   . Chronic sinusitis   . COPD (chronic obstructive pulmonary disease) ([place])   . 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 Hyperlipidemia   . Lung disease     Past Surgical History:  Procedure Laterality Date  . COLONOSCOPY N/A 08/27/2018   Procedure: COLONOSCOPY;  Surgeon: Daneil Dolin, MD;  [facility];  Service: Endoscopy;  Laterality: N/A;  7:30am  . ELBOW SURGERY Bilateral    patient states 124 bone spurs removed  . POLYPECTOMY  08/27/2018   Procedure: POLYPECTOMY;  Surgeon: Daneil Dolin, MD;  [facility];  Service: Endoscopy;;  rexctal polyp times 2 cs  . ROTATOR CUFF REPAIR Right   . TOE AMPUTATION    . TUMOR REMOVAL     back of neck  . UMBILICAL HERNIA REPAIR      There were no vitals filed for this visit.   Subjective Assessment - 03/04/21 0856    Subjective pt reports his lower back is sore from the new exercises and his knees are achey from the change in weather.  States he needs to get back to being able to care for  himself.    Currently in Pain? Yes    Pain Score 5               [facility] PT Assessment - 03/04/21 0001      Assessment   Medical Diagnosis Muscle Weakness    Referring Provider (PT) [name] NP    Onset Date/Surgical Date 02/24/09                         [facility] Adult PT Treatment/Exercise - 03/04/21 0001      Knee/Hip Exercises: Standing   Gait Training with SPC X 200 feet    Other Standing Knee Exercises hip excursions 10X each      Knee/Hip Exercises: Seated   Long Arc Quad Both;10 reps    Long Arc Quad Limitations 3" holds; HEP review    Other Seated Knee/Hip Exercises heel and toeraise HEP review    Marching Both;10 reps    Marching Limitations reviewed for HEP    Sit to Sand 5 reps;without UE support                  PT Education - 03/04/21 1012  Education Details reviewed goals, POC and HEP.  Added new therex and explained importance of keeping active    Person(s) Educated Patient;Spouse    Methods Explanation;Demonstration;Tactile cues;Verbal cues;Handout    Comprehension Verbalized understanding;Returned demonstration;Verbal cues required;Tactile cues required;Need further instruction            PT Short Term Goals - 03/04/21 0859      PT SHORT TERM GOAL #1   Title Patient will be independent with HEP in order to improve functional outcomes.    Time 3    Period Weeks    Status On-going    Target Date 03/22/21      PT SHORT TERM GOAL #2   Title Patient will report at least 25% improvement in symptoms for improved quality of life.    Time 3    Period Weeks    Status On-going    Target Date 03/22/21             PT Long Term Goals - 03/04/21 0900      PT LONG TERM GOAL #1   Title Patient will report at least 75% improvement in symptoms for improved quality of life.    Time 6    Period Weeks    Status On-going      PT LONG TERM GOAL #2   Title Patient will be able to complete 5x STS in under 11.4 seconds in order  to reduce the risk of falls.    Time 6    Period Weeks    Status On-going      PT LONG TERM GOAL #3   Title Patient will be able to score 19/24 with DGI in order to indicate improved dyanmic balance for ADL.    Time 6    Period Weeks    Status On-going      PT LONG TERM GOAL #4   Title Patient will be able to ambulate at least 400 feet in 2MWT in order to demonstrate improved gait speed for community ambulation.    Time 6    Period Weeks    Status On-going                 Plan - 03/04/21 1035    Clinical Impression Statement Reviewed goals, HEP and POC moving forward.  Pt able to recall and complete HEP with only postural cues for sitting upright with effort.   Began working on gait using [place] with cues for bending knees (not being so stiff) to reduce shuffling and risk of falling.  Pt with good cadence and sequencing.  Initiated sit to stand with cues to push from glutes and improve eccentric control when descending.  Pt able to complete these from standard height without UE's  following multiple attempts and cues to activate glutes and quads/maintain posture.    Also worked on hip and lumbar mobility as pt is very tight and rigid.    Pt given these two exercises to add to HEP.    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, cervical fusion    Examination-Activity Limitations Bathing;Locomotion Level;Transfers;Squat;Stand;Stairs;Lift;Hygiene/Grooming;Dressing;Carry;Caring for Others    Examination-Participation Restrictions Meal Prep;Cleaning;Community Activity;Shop;Volunteer;Yard Work    Stability/Clinical Decision Making Stable/Uncomplicated    Rehab Potential Fair    PT Frequency 2x / week    PT Duration 6 weeks    PT Treatment/Interventions ADLs/Self Care  Home  Management;Aquatic Therapy;Electrical Stimulation;Cryotherapy;Iontophoresis 4mg /ml Dexamethasone;Moist Heat;Traction;Ultrasound;DME Instruction;Gait training;Stair training;Functional mobility training;Therapeutic activities;Therapeutic exercise;Balance training;Neuromuscular re-education;Patient/family education;Orthotic Fit/Training;Manual techniques;Compression bandaging;Dry needling;Energy conservation;Passive range of motion;Taping;Splinting;Spinal Manipulations;Joint Manipulations    PT Next Visit Plan continue LE strengthening and progress to standing/functional strengthening as able. Progress to balance training.    PT Home Exercise Plan 3/8 marching, LAQ, HR/TR 3/11:  sit to stands, hip excursions    Consulted and Agree with Plan of Care Patient           Patient will benefit from skilled therapeutic intervention in order to improve the following deficits and impairments:  Abnormal gait,Difficulty walking,Decreased range of motion,Decreased endurance,Decreased activity tolerance,Pain,Decreased balance,Improper body mechanics,Decreased mobility,Decreased strength  Visit Diagnosis: Other abnormalities of gait and mobility  Muscle weakness (generalized)     Problem List Patient Active Problem List   Diagnosis Date Noted  . History of colonic polyps 07/18/2018  . Rectal bleeding 07/18/2018  . Abdominal pain 07/18/2018   Teena Irani, PTA/CLT [phone]  Teena Irani 03/04/2021, 10:45 AM  [place], [place], [address] Phone: [phone]   Fax:  [phone]  Name: Darrell Young MRN: [id number] Date of Birth: 10/11/1951

## 2021-03-07 NOTE — Addendum Note (Signed)
Addended by: Mearl Latin on: 03/07/2021 11:58 AM   Modules accepted: Orders

## 2021-03-08 ENCOUNTER — Ambulatory Visit (HOSPITAL_COMMUNITY): Payer: No Typology Code available for payment source | Admitting: Physical Therapy

## 2021-03-08 ENCOUNTER — Other Ambulatory Visit: Payer: Self-pay

## 2021-03-08 DIAGNOSIS — R1311 Dysphagia, oral phase: Secondary | ICD-10-CM

## 2021-03-08 DIAGNOSIS — M6281 Muscle weakness (generalized): Secondary | ICD-10-CM

## 2021-03-08 DIAGNOSIS — R2689 Other abnormalities of gait and mobility: Secondary | ICD-10-CM | POA: Diagnosis not present

## 2021-03-08 NOTE — Therapy (Signed)
[place], [place], [address] Phone: [phone]   Fax:  [phone]  Physical Therapy Treatment  Patient Details  Name: Darrell Young MRN: [id number] Date of Birth: [birth year] Referring Provider (PT): [name] NP   Encounter Date: 03/08/2021   PT End of Session - 03/08/21 0914    Visit Number 3    Number of Visits 12    Date for PT Re-Evaluation 04/12/21    Authorization Type Primary: [facility] Secondary: Medicare    Authorization - Visit Number 3    Authorization - Number of Visits 15    Progress Note Due on Visit 10    PT Start Time [phone]    PT Stop Time 0914    PT Time Calculation (min) 38 min    Activity Tolerance Patient tolerated treatment well    Behavior During Therapy [facility] for tasks assessed/performed           Past Medical History:  Diagnosis Date  . Asthma   . Chronic cough   . Chronic sinusitis   . COPD (chronic obstructive pulmonary disease) ([place])   . 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 Hyperlipidemia   . Lung disease     Past Surgical History:  Procedure Laterality Date  . COLONOSCOPY N/A 08/27/2018   Procedure: COLONOSCOPY;  Surgeon: Daneil Dolin, MD;  [facility];  Service: Endoscopy;  Laterality: N/A;  7:30am  . ELBOW SURGERY Bilateral    patient states 124 bone spurs removed  . POLYPECTOMY  08/27/2018   Procedure: POLYPECTOMY;  Surgeon: Daneil Dolin, MD;  [facility];  Service: Endoscopy;;  rexctal polyp times 2 cs  . ROTATOR CUFF REPAIR Right   . TOE AMPUTATION    . TUMOR REMOVAL     back of neck  . UMBILICAL HERNIA REPAIR      There were no vitals filed for this visit.   Subjective Assessment - 03/08/21 0903    Subjective Pt states he worked out over the weekend. No issues or complaints today, ready to get stronger.    Currently in Pain? No/denies                              [facility] Adult PT Treatment/Exercise - 03/08/21 0001      Knee/Hip Exercises: Standing   Heel Raises Both;20 reps    Heel Raises Limitations toeraises 20 reps    Forward Lunges Both;15 reps    Forward Lunges Limitations onto 4" strep with 1 UE assist    Hip Abduction Both;15 reps    Hip Extension Both;15 reps    Gait Training with SPC X 200 feet                    PT Short Term Goals - 03/04/21 0859      PT SHORT TERM GOAL #1   Title Patient will be independent with HEP in order to improve functional outcomes.    Time 3    Period Weeks    Status On-going    Target Date 03/22/21      PT SHORT TERM GOAL #2   Title Patient will report at least 25% improvement in symptoms for improved quality of life.    Time 3    Period Weeks    Status On-going  Target Date 03/22/21             PT Long Term Goals - 03/04/21 0900      PT LONG TERM GOAL #1   Title Patient will report at least 75% improvement in symptoms for improved quality of life.    Time 6    Period Weeks    Status On-going      PT LONG TERM GOAL #2   Title Patient will be able to complete 5x STS in under 11.4 seconds in order to reduce the risk of falls.    Time 6    Period Weeks    Status On-going      PT LONG TERM GOAL #3   Title Patient will be able to score 19/24 with DGI in order to indicate improved dyanmic balance for ADL.    Time 6    Period Weeks    Status On-going      PT LONG TERM GOAL #4   Title Patient will be able to ambulate at least 400 feet in 2MWT in order to demonstrate improved gait speed for community ambulation.    Time 6    Period Weeks    Status On-going                 Plan - 03/08/21 0914    Clinical Impression Statement Progressed to functional strengthening therex today following ambulation warmup using SPC.  Pt able to complete 15 reps of hip strengthening and lunges. Pt able to complete these in good form and minimal  cues.  Only able to reduce to 1 UE while completing forward lunge due to weakness with hopes to progress to no UE assist.   Pt did require 2 short seated rest breaks today.    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, cervical fusion    Examination-Activity Limitations Bathing;Locomotion Level;Transfers;Squat;Stand;Stairs;Lift;Hygiene/Grooming;Dressing;Carry;Caring for Others    Examination-Participation Restrictions Meal Prep;Cleaning;Community Activity;Shop;Volunteer;Yard Work    Stability/Clinical Decision Making Stable/Uncomplicated    Rehab Potential Fair    PT Frequency 2x / week    PT Duration 6 weeks    PT Treatment/Interventions ADLs/Self Care Home Management;Aquatic Therapy;Electrical Stimulation;Cryotherapy;Iontophoresis 4mg /ml Dexamethasone;Moist Heat;Traction;Ultrasound;DME Instruction;Gait training;Stair training;Functional mobility training;Therapeutic activities;Therapeutic exercise;Balance training;Neuromuscular re-education;Patient/family education;Orthotic Fit/Training;Manual techniques;Compression bandaging;Dry needling;Energy conservation;Passive range of motion;Taping;Splinting;Spinal Manipulations;Joint Manipulations    PT Next Visit Plan continue LE strengthening and progress to standing/functional strengthening as able. Progress to balance training.    PT Home Exercise Plan 3/8 marching, LAQ, HR/TR 3/11:  sit to stands, hip excursions    Consulted and Agree with Plan of Care Patient           Patient will benefit from skilled therapeutic intervention in order to improve the following deficits and impairments:  Abnormal gait,Difficulty walking,Decreased range of motion,Decreased endurance,Decreased activity tolerance,Pain,Decreased balance,Improper body  mechanics,Decreased mobility,Decreased strength  Visit Diagnosis: No diagnosis found.     Problem List Patient Active Problem List   Diagnosis Date Noted  . History of colonic polyps 07/18/2018  . Rectal bleeding 07/18/2018  . Abdominal pain 07/18/2018   Teena Irani, PTA/CLT [phone]  Teena Irani 03/08/2021, 9:15 AM  [place], [place], [address] Phone: [phone]   Fax:  [phone]  Name: Darrell Young MRN: [id number] Date of Birth: January 02, 1951

## 2021-03-10 ENCOUNTER — Ambulatory Visit (HOSPITAL_COMMUNITY): Payer: No Typology Code available for payment source | Admitting: Physical Therapy

## 2021-03-10 ENCOUNTER — Encounter (HOSPITAL_COMMUNITY): Payer: Self-pay | Admitting: Physical Therapy

## 2021-03-10 ENCOUNTER — Other Ambulatory Visit: Payer: Self-pay

## 2021-03-10 DIAGNOSIS — R2689 Other abnormalities of gait and mobility: Secondary | ICD-10-CM | POA: Diagnosis not present

## 2021-03-10 DIAGNOSIS — M6281 Muscle weakness (generalized): Secondary | ICD-10-CM

## 2021-03-10 NOTE — Therapy (Signed)
[place], [place], [address] Phone: [phone]   Fax:  [phone]  Physical Therapy Treatment  Patient Details  Name: Darrell Young MRN: [id number] Date of Birth: [birth year] Referring Provider (PT): [name] NP   Encounter Date: 03/10/2021   PT End of Session - 03/10/21 0823    Visit Number 4    Number of Visits 12    Date for PT Re-Evaluation 04/12/21    Authorization Type Primary: [facility] Secondary: Medicare    Authorization - Visit Number 4    Authorization - Number of Visits 15    Progress Note Due on Visit 10    PT Start Time 0818    PT Stop Time 0858    PT Time Calculation (min) 40 min    Activity Tolerance Patient tolerated treatment well;Patient limited by fatigue    Behavior During Therapy [facility] for tasks assessed/performed           Past Medical History:  Diagnosis Date  . Asthma   . Chronic cough   . Chronic sinusitis   . COPD (chronic obstructive pulmonary disease) ([place])   . 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 Hyperlipidemia   . Lung disease     Past Surgical History:  Procedure Laterality Date  . COLONOSCOPY N/A 08/27/2018   Procedure: COLONOSCOPY;  Surgeon: Daneil Dolin, MD;  [facility];  Service: Endoscopy;  Laterality: N/A;  7:30am  . ELBOW SURGERY Bilateral    patient states 124 bone spurs removed  . POLYPECTOMY  08/27/2018   Procedure: POLYPECTOMY;  Surgeon: Daneil Dolin, MD;  [facility];  Service: Endoscopy;;  rexctal polyp times 2 cs  . ROTATOR CUFF REPAIR Right   . TOE AMPUTATION    . TUMOR REMOVAL     back of neck  . UMBILICAL HERNIA REPAIR      There were no vitals filed for this visit.   Subjective Assessment - 03/10/21 2482    Subjective Patient says he is doing well. He can tell the exercises are working. No new complaints. He always has pain in his back. About a 4 today.     Currently in Pain? Yes    Pain Score 4     Pain Location Back    Pain Orientation Lower;Posterior    Pain Descriptors / Indicators Aching    Pain Type Chronic pain    Pain Onset More than a month ago    Pain Frequency Constant                             [facility] Adult PT Treatment/Exercise - 03/10/21 0001      Knee/Hip Exercises: Standing   Heel Raises Both;20 reps    Heel Raises Limitations toeraises 20 reps    Hip Abduction Both;2 sets;10 reps    Hip Extension Both;2 sets;10 reps    Forward Step Up Both;2 sets;10 reps;Hand Hold: 2;Step Height: 4"    Gait Training 226 ft with SPC    Other Standing Knee Exercises tandem stance 2 x 30" int HHA      Knee/Hip Exercises: Seated   Sit to Sand 10 reps;without UE support   elevated seat height with blue foam pad  PT Short Term Goals - 03/04/21 0859      PT SHORT TERM GOAL #1   Title Patient will be independent with HEP in order to improve functional outcomes.    Time 3    Period Weeks    Status On-going    Target Date 03/22/21      PT SHORT TERM GOAL #2   Title Patient will report at least 25% improvement in symptoms for improved quality of life.    Time 3    Period Weeks    Status On-going    Target Date 03/22/21             PT Long Term Goals - 03/04/21 0900      PT LONG TERM GOAL #1   Title Patient will report at least 75% improvement in symptoms for improved quality of life.    Time 6    Period Weeks    Status On-going      PT LONG TERM GOAL #2   Title Patient will be able to complete 5x STS in under 11.4 seconds in order to reduce the risk of falls.    Time 6    Period Weeks    Status On-going      PT LONG TERM GOAL #3   Title Patient will be able to score 19/24 with DGI in order to indicate improved dyanmic balance for ADL.    Time 6    Period Weeks    Status On-going      PT LONG TERM GOAL #4   Title Patient will be able to ambulate at least 400 feet in  2MWT in order to demonstrate improved gait speed for community ambulation.    Time 6    Period Weeks    Status On-going                 Plan - 03/10/21 0854    Clinical Impression Statement Patient tolerated progressions well today. Able to increase reps with only mild fatigue. Patient does require intermittent rest breaks. Patient progress to static balance, and did well with tandem stance. Patient has difficulty with sit to stands and requires elevated seat height with foam pad. He then was able to complete full set. Patient issued updated HEP handout. Patient will continue to benefit from therapy services to reduce limitations and improve functional ability.    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, cervical fusion    Examination-Activity Limitations Bathing;Locomotion Level;Transfers;Squat;Stand;Stairs;Lift;Hygiene/Grooming;Dressing;Carry;Caring for Others    Examination-Participation Restrictions Meal Prep;Cleaning;Community Activity;Shop;Volunteer;Yard Work    Stability/Clinical Decision Making Stable/Uncomplicated    Rehab Potential Fair    PT Frequency 2x / week    PT Duration 6 weeks    PT Treatment/Interventions ADLs/Self Care Home Management;Aquatic Therapy;Electrical Stimulation;Cryotherapy;Iontophoresis 4mg /ml Dexamethasone;Moist Heat;Traction;Ultrasound;DME Instruction;Gait training;Stair training;Functional mobility training;Therapeutic activities;Therapeutic exercise;Balance training;Neuromuscular re-education;Patient/family education;Orthotic Fit/Training;Manual techniques;Compression bandaging;Dry needling;Energy conservation;Passive range of motion;Taping;Splinting;Spinal Manipulations;Joint Manipulations    PT Next Visit Plan continue LE strengthening  and progress to standing/functional strengthening as able. Progress balance. Increase step height    PT Home Exercise Plan 3/8 marching, LAQ, HR/TR 3/11:  sit to stands, hip excursions 3/17 tandem stance at counter    Consulted and Agree with Plan of Care Patient           Patient will benefit from skilled therapeutic intervention in order to improve  the following deficits and impairments:  Abnormal gait,Difficulty walking,Decreased range of motion,Decreased endurance,Decreased activity tolerance,Pain,Decreased balance,Improper body mechanics,Decreased mobility,Decreased strength  Visit Diagnosis: Other abnormalities of gait and mobility  Muscle weakness (generalized)     Problem List Patient Active Problem List   Diagnosis Date Noted  . History of colonic polyps 07/18/2018  . Rectal bleeding 07/18/2018  . Abdominal pain 07/18/2018    9:03 AM, 03/10/21 Josue Hector PT DPT  Physical Therapist with [facility]  [phone]   [facility] [address] [place], [place], [address] Phone: [phone]   Fax:  [phone]  Name: Darrell Young MRN: [id number] Date of Birth: 02/08/1951

## 2021-03-10 NOTE — Patient Instructions (Signed)
Access Code: [email or web address] URL: [email or web address] Date: 03/10/2021 Prepared by: Josue Hector  Exercises Standing Tandem Balance with Counter Support - 2 x daily - 7 x weekly - 2 sets - 10 reps Standing Hip Abduction with Counter Support - 2 x daily - 7 x weekly - 2 sets - 10 reps Standing Hip Extension with Counter Support - 2 x daily - 7 x weekly - 1 sets - 3 reps - 20 second hold

## 2021-03-15 ENCOUNTER — Other Ambulatory Visit: Payer: Self-pay

## 2021-03-15 ENCOUNTER — Ambulatory Visit (HOSPITAL_COMMUNITY): Payer: No Typology Code available for payment source | Admitting: Physical Therapy

## 2021-03-15 ENCOUNTER — Encounter (HOSPITAL_COMMUNITY): Payer: Self-pay | Admitting: Physical Therapy

## 2021-03-15 DIAGNOSIS — R2689 Other abnormalities of gait and mobility: Secondary | ICD-10-CM

## 2021-03-15 DIAGNOSIS — M6281 Muscle weakness (generalized): Secondary | ICD-10-CM

## 2021-03-15 NOTE — Therapy (Signed)
[place], [place], [address] Phone: [phone]   Fax:  [phone]  Physical Therapy Treatment  Patient Details  Name: Darrell Young MRN: [id number] Date of Birth: 03-07-1951 Referring Provider (PT): [name] NP   Encounter Date: 03/15/2021   PT End of Session - 03/15/21 0909    Visit Number 5    Number of Visits 12    Date for PT Re-Evaluation 04/12/21    Authorization Type Primary: [facility] Secondary: Medicare    Authorization - Visit Number 5    Authorization - Number of Visits 15    Progress Note Due on Visit 10    PT Start Time 0902    PT Stop Time 0945    PT Time Calculation (min) 43 min    Activity Tolerance Patient tolerated treatment well;Patient limited by fatigue    Behavior During Therapy [facility] for tasks assessed/performed           Past Medical History:  Diagnosis Date  . Asthma   . Chronic cough   . Chronic sinusitis   . COPD (chronic obstructive pulmonary disease) ([place])   . 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 Hyperlipidemia   . Lung disease     Past Surgical History:  Procedure Laterality Date  . COLONOSCOPY N/A 08/27/2018   Procedure: COLONOSCOPY;  Surgeon: Daneil Dolin, MD;  [facility];  Service: Endoscopy;  Laterality: N/A;  7:30am  . ELBOW SURGERY Bilateral    patient states 124 bone spurs removed  . POLYPECTOMY  08/27/2018   Procedure: POLYPECTOMY;  Surgeon: Daneil Dolin, MD;  [facility];  Service: Endoscopy;;  rexctal polyp times 2 cs  . ROTATOR CUFF REPAIR Right   . TOE AMPUTATION    . TUMOR REMOVAL     back of neck  . UMBILICAL HERNIA REPAIR      There were no vitals filed for this visit.   Subjective Assessment - 03/15/21 0909    Subjective Patient says he is feeling good. He notes his pain is lower than it has been in a long time. He has no pain currently.    Currently in Pain?  No/denies    Pain Onset More than a month ago                             [facility] Adult PT Treatment/Exercise - 03/15/21 0001      Knee/Hip Exercises: Aerobic   Nustep 4 min warmup Lv 2      Knee/Hip Exercises: Standing   Heel Raises Both;20 reps    Heel Raises Limitations toeraises 20 reps    Hip Abduction Both;2 sets;10 reps    Hip Extension Both;2 sets;10 reps    Forward Step Up Both;2 sets;10 reps;Hand Hold: 2;Step Height: 6"    Other Standing Knee Exercises tandem stance 2 x 20" on foam    Other Standing Knee Exercises sidestepping 1RT no AD      Knee/Hip Exercises: Seated   Sit to Sand 10 reps;without UE support                    PT Short Term Goals - 03/04/21 0859      PT SHORT TERM GOAL #1   Title Patient will be independent with HEP in order to improve functional outcomes.  Time 3    Period Weeks    Status On-going    Target Date 03/22/21      PT SHORT TERM GOAL #2   Title Patient will report at least 25% improvement in symptoms for improved quality of life.    Time 3    Period Weeks    Status On-going    Target Date 03/22/21             PT Long Term Goals - 03/04/21 0900      PT LONG TERM GOAL #1   Title Patient will report at least 75% improvement in symptoms for improved quality of life.    Time 6    Period Weeks    Status On-going      PT LONG TERM GOAL #2   Title Patient will be able to complete 5x STS in under 11.4 seconds in order to reduce the risk of falls.    Time 6    Period Weeks    Status On-going      PT LONG TERM GOAL #3   Title Patient will be able to score 19/24 with DGI in order to indicate improved dyanmic balance for ADL.    Time 6    Period Weeks    Status On-going      PT LONG TERM GOAL #4   Title Patient will be able to ambulate at least 400 feet in 2MWT in order to demonstrate improved gait speed for community ambulation.    Time 6    Period Weeks    Status On-going                  Plan - 03/15/21 0946    Clinical Impression Statement Patient tolerated session well today. Able to complete sit to stands form standard chair height but required verbal cues for forward momentum. He does improve with further reps. Patient appears to perform LE strengthening better with counting in cadence. He feels this helps him. Patient continues to be limited by mild fatigue and requires breaks for rest throughout session, but was able to progress to larger step height with step ups. Progressed standing balance activity. Patient well challenged with this. Patient will continue to benefit from skilled therapy services to progress strength and balance for improved functional mobility.    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, cervical fusion    Examination-Activity Limitations Bathing;Locomotion Level;Transfers;Squat;Stand;Stairs;Lift;Hygiene/Grooming;Dressing;Carry;Caring for Others    Examination-Participation Restrictions Meal Prep;Cleaning;Community Activity;Shop;Volunteer;Yard Work    Stability/Clinical Decision Making Stable/Uncomplicated    Rehab Potential Fair    PT Frequency 2x / week    PT Duration 6 weeks    PT Treatment/Interventions ADLs/Self Care Home Management;Aquatic Therapy;Electrical Stimulation;Cryotherapy;Iontophoresis 4mg /ml Dexamethasone;Moist Heat;Traction;Ultrasound;DME Instruction;Gait training;Stair training;Functional mobility training;Therapeutic activities;Therapeutic exercise;Balance training;Neuromuscular re-education;Patient/family education;Orthotic Fit/Training;Manual techniques;Compression bandaging;Dry needling;Energy conservation;Passive range of motion;Taping;Splinting;Spinal Manipulations;Joint Manipulations    PT Next Visit Plan  continue LE strengthening and progress to standing/functional strengthening as able. Progress dynamic balance    PT Home Exercise Plan 3/8 marching, LAQ, HR/TR 3/11:  sit to stands, hip excursions 3/17 tandem stance at counter    Consulted and Agree with Plan of Care Patient           Patient will benefit from skilled therapeutic intervention in order to improve the following deficits and impairments:  Abnormal gait,Difficulty walking,Decreased range of motion,Decreased  endurance,Decreased activity tolerance,Pain,Decreased balance,Improper body mechanics,Decreased mobility,Decreased strength  Visit Diagnosis: Other abnormalities of gait and mobility  Muscle weakness (generalized)     Problem List Patient Active Problem List   Diagnosis Date Noted  . History of colonic polyps 07/18/2018  . Rectal bleeding 07/18/2018  . Abdominal pain 07/18/2018    9:48 AM, 03/15/21 Josue Hector PT DPT  Physical Therapist with [facility]  [phone]   [facility] [address] [place], [place], [address] Phone: [phone]   Fax:  [phone]  Name: Darrell Young MRN: [id number] Date of Birth: [birth year]

## 2021-03-17 ENCOUNTER — Ambulatory Visit (HOSPITAL_COMMUNITY): Payer: No Typology Code available for payment source | Admitting: Physical Therapy

## 2021-03-17 ENCOUNTER — Other Ambulatory Visit: Payer: Self-pay

## 2021-03-17 DIAGNOSIS — M6281 Muscle weakness (generalized): Secondary | ICD-10-CM

## 2021-03-17 DIAGNOSIS — R2689 Other abnormalities of gait and mobility: Secondary | ICD-10-CM | POA: Diagnosis not present

## 2021-03-17 DIAGNOSIS — R1311 Dysphagia, oral phase: Secondary | ICD-10-CM

## 2021-03-17 NOTE — Therapy (Signed)
[place], [place], [address] Phone: [phone]   Fax:  [phone]  Physical Therapy Treatment  Patient Details  Name: Darrell Young MRN: [id number] Date of Birth: 11/19/1951 Referring Provider (PT): [name] NP   Encounter Date: 03/17/2021   PT End of Session - 03/17/21 0915    Visit Number 6    Number of Visits 12    Date for PT Re-Evaluation 04/12/21    Authorization Type Primary: [facility] Secondary: Medicare    Authorization - Visit Number 6    Authorization - Number of Visits 15    Progress Note Due on Visit 10    PT Start Time [id number]    PT Stop Time 0920    PT Time Calculation (min) 44 min    Activity Tolerance Patient tolerated treatment well;Patient limited by fatigue    Behavior During Therapy [facility] for tasks assessed/performed           Past Medical History:  Diagnosis Date  . Asthma   . Chronic cough   . Chronic sinusitis   . COPD (chronic obstructive pulmonary disease) ([place])   . 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 Hyperlipidemia   . Lung disease     Past Surgical History:  Procedure Laterality Date  . COLONOSCOPY N/A 08/27/2018   Procedure: COLONOSCOPY;  Surgeon: Daneil Dolin, MD;  [facility];  Service: Endoscopy;  Laterality: N/A;  7:30am  . ELBOW SURGERY Bilateral    patient states 124 bone spurs removed  . POLYPECTOMY  08/27/2018   Procedure: POLYPECTOMY;  Surgeon: Daneil Dolin, MD;  [facility];  Service: Endoscopy;;  rexctal polyp times 2 cs  . ROTATOR CUFF REPAIR Right   . TOE AMPUTATION    . TUMOR REMOVAL     back of neck  . UMBILICAL HERNIA REPAIR      There were no vitals filed for this visit.                      [place] Adult PT Treatment/Exercise - 03/17/21 0001      Knee/Hip Exercises: Standing   Heel Raises Both;20 reps    Heel Raises Limitations toeraises 20 reps     Forward Lunges Both;10 reps    Forward Lunges Limitations onto 6" step no UE's    Hip Abduction Both;20 reps    Hip Extension Both;20 reps    Forward Step Up Both;20 reps;Hand Hold: 1;Step Height: 6"    Forward Step Up Limitations with power up of opposite                    PT Short Term Goals - 03/04/21 0859      PT SHORT TERM GOAL #1   Title Patient will be independent with HEP in order to improve functional outcomes.    Time 3    Period Weeks    Status On-going    Target Date 03/22/21      PT SHORT TERM GOAL #2   Title Patient will report at least 25% improvement in symptoms for improved quality of life.    Time 3    Period Weeks    Status On-going    Target Date 03/22/21             PT Long Term Goals - 03/04/21 0900  PT LONG TERM GOAL #1   Title Patient will report at least 75% improvement in symptoms for improved quality of life.    Time 6    Period Weeks    Status On-going      PT LONG TERM GOAL #2   Title Patient will be able to complete 5x STS in under 11.4 seconds in order to reduce the risk of falls.    Time 6    Period Weeks    Status On-going      PT LONG TERM GOAL #3   Title Patient will be able to score 19/24 with DGI in order to indicate improved dyanmic balance for ADL.    Time 6    Period Weeks    Status On-going      PT LONG TERM GOAL #4   Title Patient will be able to ambulate at least 400 feet in 2MWT in order to demonstrate improved gait speed for community ambulation.    Time 6    Period Weeks    Status On-going                 Plan - 03/17/21 0914    Clinical Impression Statement Continued with established therex.  Pt with noted improvement in gait and overall stability/activity tolerance.  Pt with cues to maintain forward gaze and widen stance with ambulation, however good sequencing and posturing noted.  Added standing lunge, high march and balance challenges this session.  Added power up with opposite LE  while reducing HHA to only 1 with forward step ups.  Vectors added to challenge LE stability.  Pt demonstrates good hold times and timekeeping ability with activities. Pt required 3 short seated rest breaks during session today.  Overall improving with great gains.    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, cervical fusion    Examination-Activity Limitations Bathing;Locomotion Level;Transfers;Squat;Stand;Stairs;Lift;Hygiene/Grooming;Dressing;Carry;Caring for Others    Examination-Participation Restrictions Meal Prep;Cleaning;Community Activity;Shop;Volunteer;Yard Work    Stability/Clinical Decision Making Stable/Uncomplicated    Rehab Potential Fair    PT Frequency 2x / week    PT Duration 6 weeks    PT Treatment/Interventions ADLs/Self Care Home Management;Aquatic Therapy;Electrical Stimulation;Cryotherapy;Iontophoresis 4mg /ml Dexamethasone;Moist Heat;Traction;Ultrasound;DME Instruction;Gait training;Stair training;Functional mobility training;Therapeutic activities;Therapeutic exercise;Balance training;Neuromuscular re-education;Patient/family education;Orthotic Fit/Training;Manual techniques;Compression bandaging;Dry needling;Energy conservation;Passive range of motion;Taping;Splinting;Spinal Manipulations;Joint Manipulations    PT Next Visit Plan continue LE strengthening and progress to standing/functional strengthening as able. Progress dynamic balance challenges.  Add dynamic line walking challenges and progress to balance beam as ready.    PT Home Exercise Plan 3/8 marching, LAQ, HR/TR 3/11:  sit to stands, hip excursions 3/17 tandem stance at counter    Consulted and Agree with Plan of Care Patient           Patient will benefit from skilled therapeutic intervention in  order to improve the following deficits and impairments:  Abnormal gait,Difficulty walking,Decreased range of motion,Decreased endurance,Decreased activity tolerance,Pain,Decreased balance,Improper body mechanics,Decreased mobility,Decreased strength  Visit Diagnosis: Other abnormalities of gait and mobility  Muscle weakness (generalized)  Dysphagia, oral phase     Problem List Patient Active Problem List   Diagnosis Date Noted  . History of colonic polyps 07/18/2018  . Rectal bleeding 07/18/2018  . Abdominal pain 07/18/2018   Teena Irani, PTA/CLT [phone]  Roseanne Reno B 03/17/2021, 9:18 AM  [facility] [place], [place], [address] Phone: [phone]   Fax:  [phone]  Name: Darrell Young MRN: [id number] Date of Birth: [birth year]

## 2021-03-21 ENCOUNTER — Encounter (HOSPITAL_COMMUNITY): Payer: Self-pay | Admitting: Physical Therapy

## 2021-03-21 ENCOUNTER — Ambulatory Visit (HOSPITAL_COMMUNITY): Payer: No Typology Code available for payment source | Admitting: Physical Therapy

## 2021-03-21 ENCOUNTER — Other Ambulatory Visit: Payer: Self-pay

## 2021-03-21 DIAGNOSIS — M6281 Muscle weakness (generalized): Secondary | ICD-10-CM

## 2021-03-21 DIAGNOSIS — R2689 Other abnormalities of gait and mobility: Secondary | ICD-10-CM

## 2021-03-21 NOTE — Patient Instructions (Signed)
Access Code: [email or web address] URL: [email or web address] Date: 03/21/2021 Prepared by: Mitzi Hansen Rya Rausch  Exercises Single Leg Balance with Clock Reach - 1 x daily - 7 x weekly - 5 reps - 5 second hold

## 2021-03-21 NOTE — Therapy (Signed)
[place], [place], [address] Phone: [phone]   Fax:  [phone]  Physical Therapy Treatment  Patient Details  Name: Darrell Young MRN: [id number] Date of Birth: 02/25/1951 Referring Provider (PT): [name] NP   Encounter Date: 03/21/2021   PT End of Session - 03/21/21 0819    Visit Number 7    Number of Visits 12    Date for PT Re-Evaluation 04/12/21    Authorization Type Primary: [facility] Secondary: Medicare    Authorization - Visit Number 7    Authorization - Number of Visits 15    Progress Note Due on Visit 10    PT Start Time 0820    PT Stop Time 0900    PT Time Calculation (min) 40 min    Activity Tolerance Patient tolerated treatment well;Patient limited by fatigue    Behavior During Therapy [facility] for tasks assessed/performed           Past Medical History:  Diagnosis Date  . Asthma   . Chronic cough   . Chronic sinusitis   . COPD (chronic obstructive pulmonary disease) ([place])   . 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 Hyperlipidemia   . Lung disease     Past Surgical History:  Procedure Laterality Date  . COLONOSCOPY N/A 08/27/2018   Procedure: COLONOSCOPY;  Surgeon: Daneil Dolin, MD;  [facility];  Service: Endoscopy;  Laterality: N/A;  7:30am  . ELBOW SURGERY Bilateral    patient states 124 bone spurs removed  . POLYPECTOMY  08/27/2018   Procedure: POLYPECTOMY;  Surgeon: Daneil Dolin, MD;  [facility];  Service: Endoscopy;;  rexctal polyp times 2 cs  . ROTATOR CUFF REPAIR Right   . TOE AMPUTATION    . TUMOR REMOVAL     back of neck  . UMBILICAL HERNIA REPAIR      There were no vitals filed for this visit.   Subjective Assessment - 03/21/21 0820    Subjective Patient states everything is getting better. No falls and no stumbles lately. His home exercises are going well.    Currently in Pain?  No/denies    Pain Onset More than a month ago                             [facility] Adult PT Treatment/Exercise - 03/21/21 0001      Knee/Hip Exercises: Aerobic   Nustep 4 min warmup Lv 2      Knee/Hip Exercises: Standing   Heel Raises Both;20 reps    Heel Raises Limitations toeraises 20 reps    Forward Step Up Both;Hand Hold: 1;Step Height: 6";2 sets;10 reps    Forward Step Up Limitations with power up of opposite    Rocker Board 1 minute    Rocker Board Limitations 2x lateral    SLS with Vectors 5x5 second holds bilateral    Other Standing Knee Exercises tandem stance 2 x 30" on foam    Other Standing Knee Exercises sidestepping 6x15 feet                  PT Education - 03/21/21 0820    Education Details HEP, exercise mechanics    Person(s) Educated Patient    Methods Explanation;Demonstration    Comprehension Verbalized understanding;Returned demonstration  PT Short Term Goals - 03/04/21 0859      PT SHORT TERM GOAL #1   Title Patient will be independent with HEP in order to improve functional outcomes.    Time 3    Period Weeks    Status On-going    Target Date 03/22/21      PT SHORT TERM GOAL #2   Title Patient will report at least 25% improvement in symptoms for improved quality of life.    Time 3    Period Weeks    Status On-going    Target Date 03/22/21             PT Long Term Goals - 03/04/21 0900      PT LONG TERM GOAL #1   Title Patient will report at least 75% improvement in symptoms for improved quality of life.    Time 6    Period Weeks    Status On-going      PT LONG TERM GOAL #2   Title Patient will be able to complete 5x STS in under 11.4 seconds in order to reduce the risk of falls.    Time 6    Period Weeks    Status On-going      PT LONG TERM GOAL #3   Title Patient will be able to score 19/24 with DGI in order to indicate improved dyanmic balance for ADL.    Time 6    Period Weeks    Status  On-going      PT LONG TERM GOAL #4   Title Patient will be able to ambulate at least 400 feet in 2MWT in order to demonstrate improved gait speed for community ambulation.    Time 6    Period Weeks    Status On-going                 Plan - 03/21/21 0820    Clinical Impression Statement Began session with nu step for dynamic warm up and improving endurance. Patient overall progressing well with decreased pain and improved function with ADL since beginning therapy. Patient completes step up with knee drive for dynamic balance and requires unilateral UE support. Added SLS with vectors which patient completes with good mechanics but requires finger support for impaired balance. Patient given several rest breaks during session due to fatigue. Patient will continue to benefit from skilled physical therapy in order to reduce impairment and improve function.    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, cervical fusion    Examination-Activity Limitations Bathing;Locomotion Level;Transfers;Squat;Stand;Stairs;Lift;Hygiene/Grooming;Dressing;Carry;Caring for Others    Examination-Participation Restrictions Meal Prep;Cleaning;Community Activity;Shop;Volunteer;Yard Work    Stability/Clinical Decision Making Stable/Uncomplicated    Rehab Potential Fair    PT Frequency 2x / week    PT Duration 6 weeks    PT Treatment/Interventions ADLs/Self Care Home Management;Aquatic Therapy;Electrical Stimulation;Cryotherapy;Iontophoresis 4mg /ml Dexamethasone;Moist Heat;Traction;Ultrasound;DME Instruction;Gait training;Stair training;Functional mobility training;Therapeutic activities;Therapeutic exercise;Balance training;Neuromuscular re-education;Patient/family education;Orthotic Fit/Training;Manual  techniques;Compression bandaging;Dry needling;Energy conservation;Passive range of motion;Taping;Splinting;Spinal Manipulations;Joint Manipulations    PT Next Visit Plan continue LE strengthening and progress to standing/functional strengthening as able. Progress dynamic balance challenges.  Add dynamic line walking challenges and progress to balance beam as ready.    PT Home Exercise Plan 3/8 marching, LAQ, HR/TR 3/11:  sit to stands, hip excursions 3/17 tandem stance at counter 3/28 SLS with vectors  Consulted and Agree with Plan of Care Patient           Patient will benefit from skilled therapeutic intervention in order to improve the following deficits and impairments:  Abnormal gait,Difficulty walking,Decreased range of motion,Decreased endurance,Decreased activity tolerance,Pain,Decreased balance,Improper body mechanics,Decreased mobility,Decreased strength  Visit Diagnosis: Other abnormalities of gait and mobility  Muscle weakness (generalized)     Problem List Patient Active Problem List   Diagnosis Date Noted  . History of colonic polyps 07/18/2018  . Rectal bleeding 07/18/2018  . Abdominal pain 07/18/2018    9:03 AM, 03/21/21 Mearl Latin PT, DPT Physical Therapist at [place], [place], [address] Phone: [phone]   Fax:  [phone]  Name: Darrell Young MRN: [id number] Date of Birth: March 10, 1951

## 2021-03-24 ENCOUNTER — Encounter (HOSPITAL_COMMUNITY): Payer: Self-pay | Admitting: Physical Therapy

## 2021-03-24 ENCOUNTER — Ambulatory Visit (HOSPITAL_COMMUNITY): Payer: No Typology Code available for payment source | Admitting: Physical Therapy

## 2021-03-24 ENCOUNTER — Other Ambulatory Visit: Payer: Self-pay

## 2021-03-24 DIAGNOSIS — M6281 Muscle weakness (generalized): Secondary | ICD-10-CM

## 2021-03-24 DIAGNOSIS — R2689 Other abnormalities of gait and mobility: Secondary | ICD-10-CM | POA: Diagnosis not present

## 2021-03-24 NOTE — Therapy (Signed)
[place], [place], [address] Phone: [phone]   Fax:  [phone]  Physical Therapy Treatment  Patient Details  Name: Darrell Young MRN: [id number] Date of Birth: 12/21/1951 Referring Provider (PT): [name] NP   Encounter Date: 03/24/2021   PT End of Session - 03/24/21 0908    Visit Number 8    Number of Visits 12    Date for PT Re-Evaluation 04/12/21    Authorization Type Primary: [facility] community [facility] Secondary: Medicare    Authorization - Visit Number 8    Authorization - Number of Visits 15    Progress Note Due on Visit 10    PT Start Time 0905    PT Stop Time 0945    PT Time Calculation (min) 40 min    Activity Tolerance Patient tolerated treatment well    Behavior During Therapy [facility] for tasks assessed/performed           Past Medical History:  Diagnosis Date  . Asthma   . Chronic cough   . Chronic sinusitis   . COPD (chronic obstructive pulmonary disease) ([place])   . 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 Hyperlipidemia   . Lung disease     Past Surgical History:  Procedure Laterality Date  . COLONOSCOPY N/A 08/27/2018   Procedure: COLONOSCOPY;  Surgeon: Daneil Dolin, MD;  [facility];  Service: Endoscopy;  Laterality: N/A;  7:30am  . ELBOW SURGERY Bilateral    patient states 124 bone spurs removed  . POLYPECTOMY  08/27/2018   Procedure: POLYPECTOMY;  Surgeon: Daneil Dolin, MD;  [facility];  Service: Endoscopy;;  rexctal polyp times 2 cs  . ROTATOR CUFF REPAIR Right   . TOE AMPUTATION    . TUMOR REMOVAL     back of neck  . UMBILICAL HERNIA REPAIR      There were no vitals filed for this visit.   Subjective Assessment - 03/24/21 0907    Subjective Patient says he is feeling good. He is not having any pain right now. Feeling good with the exercise.    Currently in Pain? No/denies    Pain Onset More than a month ago                              [facility] Adult PT Treatment/Exercise - 03/24/21 0001      Knee/Hip Exercises: Machines for Strengthening   Cybex Leg Press 40# x 10, 50# x 10      Knee/Hip Exercises: Standing   Heel Raises Both;20 reps    Heel Raises Limitations toeraises 20 reps    Hip Abduction Both;20 reps    Abduction Limitations RTB    Hip Extension Both;20 reps    Extension Limitations RTB    Forward Step Up Both;Hand Hold: 1;Step Height: 6";2 sets;10 reps    Forward Step Up Limitations with power up of opposite    Other Standing Knee Exercises tandem stance 3 x 30" on foam    Other Standing Knee Exercises tandem gait 2 RT                    PT Short Term Goals - 03/04/21 0859      PT SHORT TERM GOAL #1   Title Patient will be independent with HEP in order to improve functional  outcomes.    Time 3    Period Weeks    Status On-going    Target Date 03/22/21      PT SHORT TERM GOAL #2   Title Patient will report at least 25% improvement in symptoms for improved quality of life.    Time 3    Period Weeks    Status On-going    Target Date 03/22/21             PT Long Term Goals - 03/04/21 0900      PT LONG TERM GOAL #1   Title Patient will report at least 75% improvement in symptoms for improved quality of life.    Time 6    Period Weeks    Status On-going      PT LONG TERM GOAL #2   Title Patient will be able to complete 5x STS in under 11.4 seconds in order to reduce the risk of falls.    Time 6    Period Weeks    Status On-going      PT LONG TERM GOAL #3   Title Patient will be able to score 19/24 with DGI in order to indicate improved dyanmic balance for ADL.    Time 6    Period Weeks    Status On-going      PT LONG TERM GOAL #4   Title Patient will be able to ambulate at least 400 feet in 2MWT in order to demonstrate improved gait speed for community ambulation.    Time 6    Period Weeks    Status On-going                  Plan - 03/24/21 1020    Clinical Impression Statement Patient is progressing well. Able to increase resistance with hip abduction and extension. Added leg press for LE strength progression. Patient educated on proper form and function of activity. Patient did very well with static balance today, progressed to tandem gait. Patient will continue to benefit from skilled therapy services to reduce limitations and improve functional ability.    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, cervical fusion    Examination-Activity Limitations Bathing;Locomotion Level;Transfers;Squat;Stand;Stairs;Lift;Hygiene/Grooming;Dressing;Carry;Caring for Others    Examination-Participation Restrictions Meal Prep;Cleaning;Community Activity;Shop;Volunteer;Yard Work    Stability/Clinical Decision Making Stable/Uncomplicated    Rehab Potential Fair    PT Frequency 2x / week    PT Duration 6 weeks    PT Treatment/Interventions ADLs/Self Care Home Management;Aquatic Therapy;Electrical Stimulation;Cryotherapy;Iontophoresis 4mg /ml Dexamethasone;Moist Heat;Traction;Ultrasound;DME Instruction;Gait training;Stair training;Functional mobility training;Therapeutic activities;Therapeutic exercise;Balance training;Neuromuscular re-education;Patient/family education;Orthotic Fit/Training;Manual techniques;Compression bandaging;Dry needling;Energy conservation;Passive range of motion;Taping;Splinting;Spinal Manipulations;Joint Manipulations    PT Next Visit Plan continue LE strengthening and progress to standing/functional strengthening as able. Progress dynamic balance challenges. Try retro walk    PT Home Exercise Plan 3/8 marching, LAQ, HR/TR 3/11:  sit to stands, hip excursions 3/17 tandem stance at counter  3/28 SLS with vectors 3/31 hip abduction/ extension    Consulted and Agree with Plan of Care Patient           Patient will benefit from skilled therapeutic intervention in order to improve the following deficits and impairments:  Abnormal gait,Difficulty walking,Decreased range of motion,Decreased endurance,Decreased activity tolerance,Pain,Decreased balance,Improper body mechanics,Decreased mobility,Decreased strength  Visit Diagnosis: Other abnormalities of gait and mobility  Muscle weakness (generalized)  Problem List Patient Active Problem List   Diagnosis Date Noted  . History of colonic polyps 07/18/2018  . Rectal bleeding 07/18/2018  . Abdominal pain 07/18/2018    10:26 AM, 03/24/21 Josue Hector PT DPT  Physical Therapist with [facility]  [phone] [place] [address] [place], [place], [address] Phone: [phone]   Fax:  [phone]  Name: Randol Zumstein MRN: [id number] Date of Birth: 28-Oct-1951

## 2021-03-29 ENCOUNTER — Other Ambulatory Visit: Payer: Self-pay

## 2021-03-29 ENCOUNTER — Ambulatory Visit (HOSPITAL_COMMUNITY): Payer: No Typology Code available for payment source | Attending: Nurse Practitioner

## 2021-03-29 ENCOUNTER — Encounter (HOSPITAL_COMMUNITY): Payer: Self-pay

## 2021-03-29 DIAGNOSIS — R2689 Other abnormalities of gait and mobility: Secondary | ICD-10-CM | POA: Insufficient documentation

## 2021-03-29 DIAGNOSIS — M6281 Muscle weakness (generalized): Secondary | ICD-10-CM | POA: Diagnosis present

## 2021-03-29 DIAGNOSIS — R1311 Dysphagia, oral phase: Secondary | ICD-10-CM | POA: Insufficient documentation

## 2021-03-29 NOTE — Patient Instructions (Signed)
ABDUCTION: Standing (Active)    Stand, feet flat. Lift right leg out to side. Use theraband around thigh Complete 2 sets of 10 repetitions. Perform 4 sessions per week.  http://gtsc.exer.us/110   Copyright  VHI. All rights reserved.    Hip Extension (Standing)    Stand with support. Squeeze pelvic floor and hold.  Theraband above knees.   Move right leg backward with straight knee. Hold for 5 seconds.  Repeat 10 times. Do 2 times a day. Repeat with other leg.  Copyright  VHI. All rights reserved.

## 2021-03-29 NOTE — Therapy (Signed)
[place], [place], [address] Phone: [phone]   Fax:  [phone]  Physical Therapy Treatment  Patient Details  Name: Darrell Young MRN: [id number] Date of Birth: Mar 22, 1951 Referring Provider (PT): [name] NP   Encounter Date: 03/29/2021   PT End of Session - 03/29/21 1418    Visit Number 9    Number of Visits 12    Date for PT Re-Evaluation 04/12/21    Authorization Type Primary: [facility] Secondary: Medicare    Authorization - Visit Number 9    Authorization - Number of Visits 15    Progress Note Due on Visit 10    PT Start Time 1415    PT Stop Time 1500    PT Time Calculation (min) 45 min    Equipment Utilized During Treatment --   SBA   Activity Tolerance Patient tolerated treatment well    Behavior During Therapy [facility] for tasks assessed/performed           Past Medical History:  Diagnosis Date  . Asthma   . Chronic cough   . Chronic sinusitis   . COPD (chronic obstructive pulmonary disease) ([place])   . 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 Hyperlipidemia   . Lung disease     Past Surgical History:  Procedure Laterality Date  . COLONOSCOPY N/A 08/27/2018   Procedure: COLONOSCOPY;  Surgeon: Daneil Dolin, MD;  [facility];  Service: Endoscopy;  Laterality: N/A;  7:30am  . ELBOW SURGERY Bilateral    patient states 124 bone spurs removed  . POLYPECTOMY  08/27/2018   Procedure: POLYPECTOMY;  Surgeon: Daneil Dolin, MD;  [facility];  Service: Endoscopy;;  rexctal polyp times 2 cs  . ROTATOR CUFF REPAIR Right   . TOE AMPUTATION    . TUMOR REMOVAL     back of neck  . UMBILICAL HERNIA REPAIR      There were no vitals filed for this visit.   Subjective Assessment - 03/29/21 1417    Subjective Pt stated he is doing great, no reports of pain.  [facility] today with good results.  No reoprts of changed  medication and no reports of recent fall.  Compliant with HEP.    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 No/denies                             [facility] Adult PT Treatment/Exercise - 03/29/21 0001      Knee/Hip Exercises: Machines for Strengthening   Cybex Leg Press 50# 10x 2 sets slow mechanics    Other Machine Retro gait with 30# 1RT then 40# 5RT      Knee/Hip Exercises: Standing   Heel Raises Both;20 reps    Heel Raises Limitations toeraises 20 reps    Hip Abduction Both;2 sets;10 reps;Knee straight    Abduction Limitations RTB around thigh 5" holds    Forward Step Up Both;Hand Hold: 1;Step Height: 6";15 reps    Forward Step Up Limitations with power up of opposite    Other Standing Knee Exercises sidestep RTB around thigh 3RT down blue line    Other Standing  Knee Exercises tandem gait x 1RT, on balance beam x 2 RT intermittent HHA required x 4      Knee/Hip Exercises: Seated   Sit to Sand 2 sets;5 reps;without UE support   slow down and power up, cueing for mechanics                   PT Short Term Goals - 03/04/21 0859      PT SHORT TERM GOAL #1   Title Patient will be independent with HEP in order to improve functional outcomes.    Time 3    Period Weeks    Status On-going    Target Date 03/22/21      PT SHORT TERM GOAL #2   Title Patient will report at least 25% improvement in symptoms for improved quality of life.    Time 3    Period Weeks    Status On-going    Target Date 03/22/21             PT Long Term Goals - 03/04/21 0900      PT LONG TERM GOAL #1   Title Patient will report at least 75% improvement in symptoms for improved quality of life.    Time 6    Period Weeks    Status On-going      PT LONG TERM GOAL #2   Title Patient will be able to  complete 5x STS in under 11.4 seconds in order to reduce the risk of falls.    Time 6    Period Weeks    Status On-going      PT LONG TERM GOAL #3   Title Patient will be able to score 19/24 with DGI in order to indicate improved dyanmic balance for ADL.    Time 6    Period Weeks    Status On-going      PT LONG TERM GOAL #4   Title Patient will be able to ambulate at least 400 feet in 2MWT in order to demonstrate improved gait speed for community ambulation.    Time 6    Period Weeks    Status On-going                 Plan - 03/29/21 1507    Clinical Impression Statement Pt tolerated session and is progress well.  Given RTB to add to hip strengthening exercises with current HEP.  Added retro gait with good control.  Able to complete tandem gait with no LOB and no A required so progressed to balance beam this session, required 4 sets of intermittent HHA onto wall with change of dynamic surface.  Noted decreased eccentric control near chair, encouraged to complete slow and controlled and cueing for gluteal activation to reduce anterior Rt knee irritation wiht good mechanics following verbal cueing.  Plan to progress gluteal strenghtening to assist with STS from low/soft surface next session.  No reports of pain, was limited by fatigue required periodic seated rest breaks.    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, cervical fusion    Examination-Activity Limitations Bathing;Locomotion Level;Transfers;Squat;Stand;Stairs;Lift;Hygiene/Grooming;Dressing;Carry;Caring for Others    Examination-Participation Restrictions Meal Prep;Cleaning;Community Activity;Shop;Volunteer;Yard Work    Stability/Clinical Decision Making Stable/Uncomplicated    Designer, jewellery Low     Rehab Potential Fair    PT Frequency 2x / week  PT Duration 6 weeks    PT Treatment/Interventions ADLs/Self Care Home Management;Aquatic Therapy;Electrical Stimulation;Cryotherapy;Iontophoresis 4mg /ml Dexamethasone;Moist Heat;Traction;Ultrasound;DME Instruction;Gait training;Stair training;Functional mobility training;Therapeutic activities;Therapeutic exercise;Balance training;Neuromuscular re-education;Patient/family education;Orthotic Fit/Training;Manual techniques;Compression bandaging;Dry needling;Energy conservation;Passive range of motion;Taping;Splinting;Spinal Manipulations;Joint Manipulations    PT Next Visit Plan continue LE strengthening and progress to standing/functional strengthening as able. Progress dynamic balance challenges. Add squat and lunges.    PT Home Exercise Plan 3/8 marching, LAQ, HR/TR 3/11:  sit to stands, hip excursions 3/17 tandem stance at counter 3/28 SLS with vectors 3/31 hip abduction/ extension; 4/5: given RTB to add with hip exercises    Consulted and Agree with Plan of Care Patient           Patient will benefit from skilled therapeutic intervention in order to improve the following deficits and impairments:  Abnormal gait,Difficulty walking,Decreased range of motion,Decreased endurance,Decreased activity tolerance,Pain,Decreased balance,Improper body mechanics,Decreased mobility,Decreased strength  Visit Diagnosis: Other abnormalities of gait and mobility  Muscle weakness (generalized)     Problem List Patient Active Problem List   Diagnosis Date Noted  . History of colonic polyps 07/18/2018  . Rectal bleeding 07/18/2018  . Abdominal pain 07/18/2018   Ihor Austin, LPTA/CLT; CBIS [phone]  Aldona Lento 03/29/2021, 3:15 PM  [place], [place], [address] Phone: [phone]   Fax:  [phone]  Name: Darrell Young MRN: [id number] Date of Birth:  11-24-1951

## 2021-03-31 ENCOUNTER — Other Ambulatory Visit: Payer: Self-pay

## 2021-03-31 ENCOUNTER — Ambulatory Visit (HOSPITAL_COMMUNITY): Payer: No Typology Code available for payment source | Admitting: Physical Therapy

## 2021-03-31 DIAGNOSIS — R2689 Other abnormalities of gait and mobility: Secondary | ICD-10-CM

## 2021-03-31 DIAGNOSIS — R1311 Dysphagia, oral phase: Secondary | ICD-10-CM

## 2021-03-31 DIAGNOSIS — M6281 Muscle weakness (generalized): Secondary | ICD-10-CM

## 2021-03-31 NOTE — Patient Instructions (Signed)
Hamstring Stretch (Sitting)    Sitting, extend one leg and place hands on same thigh for support. Keeping torso straight, lean forward, sliding hands down leg, until a stretch is felt in back of thigh. Hold _20-30__ seconds. Repeat with other leg.  Complete 3 sets on each leg _2-3_ times a day.   Functional Quadriceps: Sit to Stand     Sit on edge of chair, feet flat on floor. Stand upright, extending knees fully.  Repeat _10___ times per set. Do _1-_2__ sessions per day.

## 2021-03-31 NOTE — Therapy (Addendum)
[place], [place], [address] Phone: [phone]   Fax:  [phone]  Physical Therapy Treatment Progress Note Reporting Period 03/01/2021 to 03/31/2021  See note below for Objective Data and Assessment of Progress/Goals.      Patient Details  Name: Darrell Young MRN: [id number] Date of Birth: Dec 03, 1951 Referring Provider (PT): [name] NP   Encounter Date: 03/31/2021   PT End of Session - 03/31/21 1311    Visit Number 10    Number of Visits 12    Date for PT Re-Evaluation 04/12/21    Authorization Type Primary: [facility] Secondary: Medicare    Authorization - Visit Number 10    Authorization - Number of Visits 15    Progress Note Due on Visit 20    PT Start Time 0924    PT Stop Time 1003    PT Time Calculation (min) 39 min    Equipment Utilized During Treatment --   SBA   Activity Tolerance Patient tolerated treatment well    Behavior During Therapy WFL for tasks assessed/performed           Past Medical History:  Diagnosis Date  . Asthma   . Chronic cough   . Chronic sinusitis   . COPD (chronic obstructive pulmonary disease) ([place])   . 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 Hyperlipidemia   . Lung disease     Past Surgical History:  Procedure Laterality Date  . COLONOSCOPY N/A 08/27/2018   Procedure: COLONOSCOPY;  Surgeon: Daneil Dolin, MD;  [facility];  Service: Endoscopy;  Laterality: N/A;  7:30am  . ELBOW SURGERY Bilateral    patient states 124 bone spurs removed  . POLYPECTOMY  08/27/2018   Procedure: POLYPECTOMY;  Surgeon: Daneil Dolin, MD;  [facility];  Service: Endoscopy;;  rexctal polyp times 2 cs  . ROTATOR CUFF REPAIR Right   . TOE AMPUTATION    . TUMOR REMOVAL     back of neck  . UMBILICAL HERNIA REPAIR      There were no vitals filed for this visit.   Subjective Assessment - 03/31/21 0950     Subjective pt states he is sore today from the new exercises given last visit.  States he is right at 50% improved.  No pain just soreness.              [facility] PT Assessment - 03/31/21 0001      Assessment   Medical Diagnosis Muscle Weakness    Referring Provider (PT) [name] NP    Onset Date/Surgical Date 02/24/09      Posture/Postural Control   Posture Comments forward bend 40 degres from seated      Strength   Right/Left Knee Right;Left    Right Knee Flexion 5/5   was 4+/5   Right Knee Extension 4+/5   was 4+/5   Left Knee Flexion 5/5   was 5   Left Knee Extension 5/5   was 5   Right/Left Ankle Right;Left    Right Ankle Dorsiflexion 5/5   was 5   Left Ankle Dorsiflexion 5/5   was 5     Transfers   Five time sit to stand comments  8.33 without UE's   was 20.75     Ambulation/Gait   Ambulation/Gait Yes    Ambulation/Gait Assistance 7: Independent    Ambulation  Distance (Feet) 685 Feet   was 350 feet with SPC   Assistive device None    Gait Comments 2MWT      Standardized Balance Assessment   Standardized Balance Assessment Dynamic Gait Index      Dynamic Gait Index   Level Surface Normal    Change in Gait Speed Mild Impairment    Gait with Horizontal Head Turns Normal    Gait with Vertical Head Turns Normal    Gait and Pivot Turn Mild Impairment    Step Over Obstacle Mild Impairment    Step Around Obstacles Normal    Steps Normal    Total Score 21    DGI comment: no knee pain (did have knee pain at previous test 3/8)                         [facility] Adult PT Treatment/Exercise - 03/31/21 0001      Knee/Hip Exercises: Stretches   Active Hamstring Stretch Both;3 reps;20 seconds    Active Hamstring Stretch Limitations long sitting    Other Knee/Hip Stretches forward stretches 30"                  PT Education - 03/31/21 0959    Education Details review of goals and progress made; remaining deficits and discussion of further  issues not resolved.    Person(s) Educated Patient    Methods Explanation    Comprehension Verbalized understanding            PT Short Term Goals - 03/31/21 0947      PT SHORT TERM GOAL #1   Title Patient will be independent with HEP in order to improve functional outcomes.    Time 3    Period Weeks    Status Achieved    Target Date 03/22/21      PT SHORT TERM GOAL #2   Title Patient will report at least 25% improvement in symptoms for improved quality of life.    Time 3    Period Weeks    Status Achieved    Target Date 03/22/21             PT Long Term Goals - 03/31/21 0947      PT LONG TERM GOAL #1   Title Patient will report at least 75% improvement in symptoms for improved quality of life.    Time 6    Period Weeks    Status On-going      PT LONG TERM GOAL #2   Title Patient will be able to complete 5x STS in under 11.4 seconds in order to reduce the risk of falls.    Time 6    Period Weeks    Status Achieved      PT LONG TERM GOAL #3   Title Patient will be able to score 19/24 with DGI in order to indicate improved dyanmic balance for ADL.    Time 6    Period Weeks    Status Achieved      PT LONG TERM GOAL #4   Title Patient will be able to ambulate at least 400 feet in 2MWT in order to demonstrate improved gait speed for community ambulation.    Time 6    Period Weeks    Status Achieved                 Plan - 03/31/21 1318    Clinical Impression Statement 10th visit PN  completed today.  Pt has made tremendous gains towards goals and exceeded most of them.  Completed 2MWT without AD with gait velocity of 1.74 m/sec indicating a safe community ambulator without AD.  The only goal not met was the LTG of 75% improved.  Pt also voices continued tightness and inability to reach forward to donn/doff shoes.  Pt instructed with seated hamstring stretch which indicated extreme tightness in bilateral hamstrings.  Pt only able to lean 40 degrees forward  from seated position.    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, cervical fusion    Examination-Activity Limitations Bathing;Locomotion Level;Transfers;Squat;Stand;Stairs;Lift;Hygiene/Grooming;Dressing;Carry;Caring for Others    Examination-Participation Restrictions Meal Prep;Cleaning;Community Activity;Shop;Volunteer;Yard Work    Stability/Clinical Decision Making Stable/Uncomplicated    Rehab Potential Fair    PT Frequency 2x / week    PT Duration 6 weeks    PT Treatment/Interventions ADLs/Self Care Home Management;Aquatic Therapy;Electrical Stimulation;Cryotherapy;Iontophoresis 33m/ml Dexamethasone;Moist Heat;Traction;Ultrasound;DME Instruction;Gait training;Stair training;Functional mobility training;Therapeutic activities;Therapeutic exercise;Balance training;Neuromuscular re-education;Patient/family education;Orthotic Fit/Training;Manual techniques;Compression bandaging;Dry needling;Energy conservation;Passive range of motion;Taping;Splinting;Spinal Manipulations;Joint Manipulations    PT Next Visit Plan Focus on mobility and advanced functional strengthening and stability. Update goals if needed    PT Home Exercise Plan 3/8 marching, LAQ, HR/TR 3/11:  sit to stands, hip excursions 3/17 tandem stance at counter 3/28 SLS with vectors 3/31 hip abduction/ extension; 4/5: given RTB to add with hip exercises    Consulted and Agree with Plan of Care Patient            Assessment Patient has met 2/2 short term goals and 3/4 long term goals with ability to complete HEP and improvement in symptoms, strength, gait, balance, and functional mobility. Patient remains limited by deficits in balance, trunk mobility and continued symptoms. Patient overall progressing well toward remaining  goals. Patient will continue to benefit from skilled physical therapy in order to reduce impairment and improve function.   8:24 AM, 04/04/21 AMearl LatinPT, DPT [facility]  Patient will benefit from skilled therapeutic intervention in order to improve the following deficits and impairments:  Abnormal gait,Difficulty walking,Decreased range of motion,Decreased endurance,Decreased activity tolerance,Pain,Decreased balance,Improper body mechanics,Decreased mobility,Decreased strength  Visit Diagnosis: Other abnormalities of gait and mobility  Muscle weakness (generalized)  Dysphagia, oral phase     Problem List Patient Active Problem List   Diagnosis Date Noted  . History of colonic polyps 07/18/2018  . Rectal bleeding 07/18/2018  . Abdominal pain 07/18/2018   ATeena Irani PTA/CLT [phone] FTeena Irani4/06/2021, 1:18 PM  [place] [address]Phone: [phone]  Fax:  [phone] Name: VKalvin BussMRN: [id number] of Birth: 203-07-1951

## 2021-04-04 ENCOUNTER — Encounter (HOSPITAL_COMMUNITY): Payer: Self-pay | Admitting: Physical Therapy

## 2021-04-04 ENCOUNTER — Other Ambulatory Visit: Payer: Self-pay

## 2021-04-04 ENCOUNTER — Ambulatory Visit (HOSPITAL_COMMUNITY): Payer: No Typology Code available for payment source | Admitting: Physical Therapy

## 2021-04-04 DIAGNOSIS — R2689 Other abnormalities of gait and mobility: Secondary | ICD-10-CM

## 2021-04-04 DIAGNOSIS — M6281 Muscle weakness (generalized): Secondary | ICD-10-CM

## 2021-04-04 NOTE — Patient Instructions (Signed)
Access Code: [email or web address] URL: [email or web address] Date: 04/04/2021 Prepared by: Mitzi Hansen Faylinn Schwenn  Exercises Systems developer with [employer] - 1 x daily - 7 x weekly - 10 reps - 5 second hold

## 2021-04-04 NOTE — Therapy (Signed)
[place], [place], [address] Phone: [phone]   Fax:  [phone]  Physical Therapy Treatment  Patient Details  Name: Darrell Young MRN: [id number] Date of Birth: [birth year] Referring Provider (PT): [name] NP   Encounter Date: 04/04/2021   PT End of Session - 04/04/21 0831    Visit Number 11    Number of Visits 12    Date for PT Re-Evaluation 04/12/21    Authorization Type Primary: [facility] Secondary: Medicare    Authorization - Visit Number 11    Authorization - Number of Visits 15    Progress Note Due on Visit 20    PT Start Time 0831    PT Stop Time 0909    PT Time Calculation (min) 38 min    Activity Tolerance Patient tolerated treatment well    Behavior During Therapy [facility] for tasks assessed/performed           Past Medical History:  Diagnosis Date  . Asthma   . Chronic cough   . Chronic sinusitis   . COPD (chronic obstructive pulmonary disease) ([place])   . 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 Hyperlipidemia   . Lung disease     Past Surgical History:  Procedure Laterality Date  . COLONOSCOPY N/A 08/27/2018   Procedure: COLONOSCOPY;  Surgeon: Daneil Dolin, MD;  [facility];  Service: Endoscopy;  Laterality: N/A;  7:30am  . ELBOW SURGERY Bilateral    patient states 124 bone spurs removed  . POLYPECTOMY  08/27/2018   Procedure: POLYPECTOMY;  Surgeon: Daneil Dolin, MD;  [facility];  Service: Endoscopy;;  rexctal polyp times 2 cs  . ROTATOR CUFF REPAIR Right   . TOE AMPUTATION    . TUMOR REMOVAL     back of neck  . UMBILICAL HERNIA REPAIR      There were no vitals filed for this visit.   Subjective Assessment - 04/04/21 0832    Subjective Patient states he has been feeling. His home exercises are going well. He has most difficulty with the band hip exercises. He is feeling good and has not fallen.     Currently in Pain? No/denies                             [facility] Adult PT Treatment/Exercise - 04/04/21 0001      Knee/Hip Exercises: Stretches   Other Knee/Hip Stretches lumbar flexion stretch with ball while seated 1x5 5 second holds; 1x 5 5 second holds      Knee/Hip Exercises: Machines for Strengthening   Cybex Leg Press 60# 10x 2 sets slow mechanics    Other Machine Retro gait with 5plates 1x 5      Knee/Hip Exercises: Standing   Forward Step Up Both;Hand Hold: 1;15 reps;Step Height: 8"    Forward Step Up Limitations with power up of opposite    Other Standing Knee Exercises STS from 18 inch box 1 x 10                  PT Education - 04/04/21 0832    Education Details HEP, exercise mechanics, POC, obtaining a gym membership, possibly getting gym membership through [employer]) Educated Patient    Methods Explanation;Demonstration    Comprehension Verbalized understanding;Returned demonstration  PT Short Term Goals - 03/31/21 0947      PT SHORT TERM GOAL #1   Title Patient will be independent with HEP in order to improve functional outcomes.    Time 3    Period Weeks    Status Achieved    Target Date 03/22/21      PT SHORT TERM GOAL #2   Title Patient will report at least 25% improvement in symptoms for improved quality of life.    Time 3    Period Weeks    Status Achieved    Target Date 03/22/21             PT Long Term Goals - 03/31/21 0947      PT LONG TERM GOAL #1   Title Patient will report at least 75% improvement in symptoms for improved quality of life.    Time 6    Period Weeks    Status On-going      PT LONG TERM GOAL #2   Title Patient will be able to complete 5x STS in under 11.4 seconds in order to reduce the risk of falls.    Time 6    Period Weeks    Status Achieved      PT LONG TERM GOAL #3   Title Patient will be able to score 19/24 with DGI in order to indicate improved dyanmic balance  for ADL.    Time 6    Period Weeks    Status Achieved      PT LONG TERM GOAL #4   Title Patient will be able to ambulate at least 400 feet in 2MWT in order to demonstrate improved gait speed for community ambulation.    Time 6    Period Weeks    Status Achieved                 Plan - 04/04/21 0831    Clinical Impression Statement Patient overall progressing well and will plan on d/c next session. Patient showing improving strength and balance and is able to complete step up with knee drive with increased height step. Patient also able to complete increased resistance with weight training machines today. Added trunk flexion stretch will swiss ball today for improved flexion mobility. Patient with good mechanics and motor control with STS to box today. Patient will continue to benefit from skilled physical therapy in order to reduce impairment and improve function.    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, cervical fusion    Examination-Activity Limitations Bathing;Locomotion Level;Transfers;Squat;Stand;Stairs;Lift;Hygiene/Grooming;Dressing;Carry;Caring for Others    Examination-Participation Restrictions Meal Prep;Cleaning;Community Activity;Shop;Volunteer;Yard Work    Stability/Clinical Decision Making Stable/Uncomplicated    Rehab Potential Fair    PT Frequency 2x / week    PT Duration 6 weeks    PT Treatment/Interventions ADLs/Self Care Home Management;Aquatic Therapy;Electrical Stimulation;Cryotherapy;Iontophoresis 4mg /ml Dexamethasone;Moist Heat;Traction;Ultrasound;DME Instruction;Gait training;Stair training;Functional mobility training;Therapeutic activities;Therapeutic exercise;Balance training;Neuromuscular re-education;Patient/family education;Orthotic  Fit/Training;Manual techniques;Compression bandaging;Dry needling;Energy conservation;Passive range of motion;Taping;Splinting;Spinal Manipulations;Joint Manipulations    PT Next Visit Plan Focus on mobility and advanced functional strengthening and stability. anticipate DC    PT Home Exercise Plan 3/8 marching, LAQ, HR/TR 3/11:  sit to stands, hip excursions 3/17 tandem stance at counter 3/28 SLS with vectors 3/31 hip abduction/ extension; 4/5: given RTB to add with hip exercises    Consulted and Agree with Plan of  Care Patient           Patient will benefit from skilled therapeutic intervention in order to improve the following deficits and impairments:  Abnormal gait,Difficulty walking,Decreased range of motion,Decreased endurance,Decreased activity tolerance,Pain,Decreased balance,Improper body mechanics,Decreased mobility,Decreased strength  Visit Diagnosis: Other abnormalities of gait and mobility  Muscle weakness (generalized)     Problem List Patient Active Problem List   Diagnosis Date Noted  . History of colonic polyps 07/18/2018  . Rectal bleeding 07/18/2018  . Abdominal pain 07/18/2018    9:10 AM, 04/04/21 Mearl Latin PT, DPT Physical Therapist at [place], [place], [address] Phone: [phone]   Fax:  [phone]  Name: Hammad Finkler MRN: [id number] Date of Birth: Feb 27, 1951

## 2021-04-06 ENCOUNTER — Ambulatory Visit (HOSPITAL_COMMUNITY): Payer: No Typology Code available for payment source | Admitting: Physical Therapy

## 2021-04-06 ENCOUNTER — Encounter (HOSPITAL_COMMUNITY): Payer: Self-pay | Admitting: Physical Therapy

## 2021-04-06 ENCOUNTER — Other Ambulatory Visit: Payer: Self-pay

## 2021-04-06 DIAGNOSIS — R2689 Other abnormalities of gait and mobility: Secondary | ICD-10-CM

## 2021-04-06 DIAGNOSIS — M6281 Muscle weakness (generalized): Secondary | ICD-10-CM

## 2021-04-06 NOTE — Patient Instructions (Signed)
Access Code: [email or web address] URL: [email or web address] Date: 04/06/2021 Prepared by: Mitzi Hansen Tomothy Eddins  Exercises Runner's Step Up/Down - 1 x daily - 7 x weekly - 2 sets - 10 reps

## 2021-04-06 NOTE — Therapy (Signed)
[facility] [address] [place], [place], [address] Phone: [phone]   Fax:  [phone]  Physical Therapy Treatment/Discharge Summary  Patient Details  Name: Darrell Young MRN: [id number] Date of Birth: 1951-04-17 Referring Provider (PT): [name] NP   Encounter Date: 04/06/2021  PHYSICAL THERAPY DISCHARGE SUMMARY  Visits from Start of Care: 12  Current functional level related to goals / functional outcomes: See below   Remaining deficits: See below   Education / Equipment: See below  Plan: Patient agrees to discharge.  Patient goals were met. Patient is being discharged due to meeting the stated rehab goals.  ?????        PT End of Session - 04/06/21 0830    Visit Number 12    Number of Visits 12    Date for PT Re-Evaluation 04/12/21    Authorization Type Primary: [facility] Secondary: Medicare    Authorization - Visit Number 12    Authorization - Number of Visits 15    Progress Note Due on Visit 20    PT Start Time 0830    PT Stop Time 0844    PT Time Calculation (min) 14 min    Activity Tolerance Patient tolerated treatment well    Behavior During Therapy WFL for tasks assessed/performed           Past Medical History:  Diagnosis Date  . Asthma   . Chronic cough   . Chronic sinusitis   . COPD (chronic obstructive pulmonary disease) ([place])   . 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 Hyperlipidemia   . Lung disease     Past Surgical History:  Procedure Laterality Date  . COLONOSCOPY N/A 08/27/2018   Procedure: COLONOSCOPY;  Surgeon: Daneil Dolin, MD;  [facility];  Service: Endoscopy;  Laterality: N/A;  7:30am  . ELBOW SURGERY Bilateral    patient states 124 bone spurs removed  . POLYPECTOMY  08/27/2018   Procedure: POLYPECTOMY;  Surgeon: Daneil Dolin, MD;  [facility];  Service: Endoscopy;;  rexctal polyp times 2 cs  .  ROTATOR CUFF REPAIR Right   . TOE AMPUTATION    . TUMOR REMOVAL     back of neck  . UMBILICAL HERNIA REPAIR      There were no vitals filed for this visit.   Subjective Assessment - 04/06/21 0831    Subjective Patient states he is doing really well without complaints. He has some soreness in his knee cap but that's always been there. He states about 100% improvement with PT intervention. His home exercises are going well. He has not had any falls.    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 No/denies              [facility] PT Assessment - 04/06/21 0001      Assessment   Medical Diagnosis Muscle Weakness    Referring Provider (PT) [name] NP    Onset Date/Surgical Date 02/24/09    Next MD Visit in a couple months    Prior Therapy Yes for weakness and balance      Precautions   Precautions Fall      Restrictions   Weight Bearing Restrictions No      Balance Screen   Has the patient fallen in  the past 6 months No    How many times? no falls since attending therapy    Has the patient had a decrease in activity level because of a fear of falling?  No    Is the patient reluctant to leave their home because of a fear of falling?  No      Prior Function   Level of Independence Independent      Cognition   Overall Cognitive Status Within Functional Limits for tasks assessed      Observation/Other Assessments   Observations Ambulates without AD    Focus on Therapeutic Outcomes (FOTO)  n/a      Strength   Right Hip Flexion 5/5    Left Hip Flexion 5/5    Right Knee Flexion 5/5    Right Knee Extension 5/5    Left Knee Flexion 5/5    Left Knee Extension 5/5    Right Ankle Dorsiflexion 5/5    Left Ankle Dorsiflexion 5/5                                 PT Education - 04/06/21 0830    Education Details HEP, exercise mechanics,  returning to PT if needed    Person(s) Educated Patient    Methods Explanation;Demonstration    Comprehension Verbalized understanding;Returned demonstration            PT Short Term Goals - 03/31/21 0947      PT SHORT TERM GOAL #1   Title Patient will be independent with HEP in order to improve functional outcomes.    Time 3    Period Weeks    Status Achieved    Target Date 03/22/21      PT SHORT TERM GOAL #2   Title Patient will report at least 25% improvement in symptoms for improved quality of life.    Time 3    Period Weeks    Status Achieved    Target Date 03/22/21             PT Long Term Goals - 04/06/21 0836      PT LONG TERM GOAL #1   Title Patient will report at least 75% improvement in symptoms for improved quality of life.    Time 6    Period Weeks    Status Achieved      PT LONG TERM GOAL #2   Title Patient will be able to complete 5x STS in under 11.4 seconds in order to reduce the risk of falls.    Time 6    Period Weeks    Status Achieved      PT LONG TERM GOAL #3   Title Patient will be able to score 19/24 with DGI in order to indicate improved dyanmic balance for ADL.    Time 6    Period Weeks    Status Achieved      PT LONG TERM GOAL #4   Title Patient will be able to ambulate at least 400 feet in 2MWT in order to demonstrate improved gait speed for community ambulation.    Time 6    Period Weeks    Status Achieved                 Plan - 04/06/21 0830    Clinical Impression Statement Patient has met all short and long term goals with ability to complete HEP and improvement in gait, balance,  strength, and functional mobility. Patient overall has progressed very well and is not showing any limitation. Reviewed HEP with patient today and discussed joining a gym for overall health and fitness. Patient discharged from physical therapy at this time.    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, cervical fusion    Examination-Activity Limitations Bathing;Locomotion Level;Transfers;Squat;Stand;Stairs;Lift;Hygiene/Grooming;Dressing;Carry;Caring for Others    Examination-Participation Restrictions Meal Prep;Cleaning;Community Activity;Shop;Volunteer;Yard Work    Stability/Clinical Decision Making Stable/Uncomplicated    Rehab Potential Fair    PT Frequency 2x / week    PT Duration 6 weeks    PT Treatment/Interventions ADLs/Self Care Home Management;Aquatic Therapy;Electrical Stimulation;Cryotherapy;Iontophoresis 9m/ml Dexamethasone;Moist Heat;Traction;Ultrasound;DME Instruction;Gait training;Stair training;Functional mobility training;Therapeutic activities;Therapeutic exercise;Balance training;Neuromuscular re-education;Patient/family education;Orthotic Fit/Training;Manual techniques;Compression bandaging;Dry needling;Energy conservation;Passive range of motion;Taping;Splinting;Spinal Manipulations;Joint Manipulations    PT Next Visit Plan n/a    PT Home Exercise Plan 3/8 marching, LAQ, HR/TR 3/11:  sit to stands, hip excursions 3/17 tandem stance at counter 3/28 SLS with vectors 3/31 hip abduction/ extension; 4/5: given RTB to add with hip exercises    Consulted and Agree with Plan of Care Patient           Patient will benefit from skilled therapeutic intervention in order to improve the following deficits and impairments:  Abnormal gait,Difficulty walking,Decreased range of motion,Decreased endurance,Decreased activity tolerance,Pain,Decreased balance,Improper body mechanics,Decreased mobility,Decreased strength  Visit Diagnosis: Other abnormalities of gait and mobility  Muscle weakness (generalized)     Problem List Patient Active Problem List   Diagnosis Date Noted  . History of colonic  polyps 07/18/2018  . Rectal bleeding 07/18/2018  . Abdominal pain 07/18/2018    8:48 AM, 04/06/21 AMearl LatinPT, DPT [place] [address]Phone: [phone]  Fax:  [phone] Name: VBlanca ThorntonMRN: [id number] of Birth: 206/25/1952

## 2021-04-11 ENCOUNTER — Encounter (HOSPITAL_COMMUNITY): Payer: No Typology Code available for payment source | Admitting: Physical Therapy

## 2021-10-18 ENCOUNTER — Ambulatory Visit (HOSPITAL_COMMUNITY): Payer: No Typology Code available for payment source | Attending: Nurse Practitioner | Admitting: Physical Therapy

## 2021-10-18 ENCOUNTER — Encounter (HOSPITAL_COMMUNITY): Payer: Self-pay | Admitting: Physical Therapy

## 2021-10-18 ENCOUNTER — Other Ambulatory Visit: Payer: Self-pay

## 2021-10-18 DIAGNOSIS — R29898 Other symptoms and signs involving the musculoskeletal system: Secondary | ICD-10-CM | POA: Diagnosis present

## 2021-10-18 DIAGNOSIS — M6281 Muscle weakness (generalized): Secondary | ICD-10-CM | POA: Diagnosis present

## 2021-10-18 DIAGNOSIS — M545 Low back pain, unspecified: Secondary | ICD-10-CM | POA: Diagnosis present

## 2021-10-18 DIAGNOSIS — R2689 Other abnormalities of gait and mobility: Secondary | ICD-10-CM | POA: Insufficient documentation

## 2021-10-18 NOTE — Therapy (Signed)
[name], [place], [address] Phone: [phone]   Fax:  [phone]  Physical Therapy Evaluation  Patient Details  Name: Darrell Young MRN: [id number] Date of Birth: 16-Mar-1951 Referring Provider (PT): [name] NP   Encounter Date: 10/18/2021   PT End of Session - 10/18/21 0904     Visit Number 1    Number of Visits 12    Date for PT Re-Evaluation 11/29/21    Authorization Type Primary [facility] community care (15 visits within 120 days) Secondary : Medicare    Progress Note Due on Visit 10    PT Start Time 0908    PT Stop Time 0942    PT Time Calculation (min) 34 min    Activity Tolerance Patient tolerated treatment well    Behavior During Therapy [facility] for tasks assessed/performed             Past Medical History:  Diagnosis Date   Asthma    Chronic cough    Chronic sinusitis    COPD (chronic obstructive pulmonary disease) ([place])    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   Hyperlipidemia    Lung disease     Past Surgical History:  Procedure Laterality Date   COLONOSCOPY N/A 08/27/2018   Procedure: COLONOSCOPY;  Surgeon: Daneil Dolin, MD;  [facility];  Service: Endoscopy;  Laterality: N/A;  7:30am   ELBOW SURGERY Bilateral    patient states 124 bone spurs removed   POLYPECTOMY  08/27/2018   Procedure: POLYPECTOMY;  Surgeon: Daneil Dolin, MD;  [facility];  Service: Endoscopy;;  rexctal polyp times 2 cs   ROTATOR CUFF REPAIR Right    TOE AMPUTATION     TUMOR REMOVAL     back of neck   UMBILICAL HERNIA REPAIR      There were no vitals filed for this visit.    Subjective Assessment - 10/18/21 0905     Subjective Patient is a 70 y.o. male who presents to physical therapy with c/o LBP. He is still feeling strong and having issues with back sometimes. He feels that is is probably because he is getting older. He has increased  symptoms with bending. He is working on [employer] from prior PT sessions. His main goal is to improve low back mobility.    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 Yes    Pain Score 6     Pain Location Back    Pain Orientation Lower    Pain Descriptors / Indicators Tightness    Pain Type Chronic pain    Pain Onset More than a month ago    Pain Frequency Constant                [facility] PT Assessment - 10/18/21 0001       Assessment   Medical Diagnosis Lumbar spondylosis    Referring Provider (PT) [name] NP    Onset Date/Surgical Date 10/18/96    Prior Therapy yes      Precautions   Precautions None      Restrictions   Weight Bearing Restrictions No      Balance Screen   Has the patient fallen in  the past 6 months No    Has the patient had a decrease in activity level because of a fear of falling?  No    Is the patient reluctant to leave their home because of a fear of falling?  No      Prior Function   Level of Independence Independent    Vocation Retired      Charity fundraiser Status Within Functional Limits for tasks assessed      Observation/Other Assessments   Observations Ambulates without AD    Focus on Therapeutic Outcomes (FOTO)  61% function      Sensation   Light Touch Appears Intact      Posture/Postural Control   Posture/Postural Control Postural limitations    Postural Limitations Decreased lumbar lordosis      ROM / Strength   AROM / PROM / Strength AROM;Strength      AROM   AROM Assessment Site Lumbar    Lumbar Flexion 25% limited    Lumbar Extension 75% limited, pain lower R lumbar    Lumbar - Right Side Bend 25% limited, pain lower R lumbar    Lumbar - Left Side Bend 25% limited    Lumbar - Right Rotation 50% limited    Lumbar -  Left Rotation 50% limited      Strength   Overall Strength Comments R hip/glute pain with R hip MMT    Strength Assessment Site Hip;Knee    Right/Left Hip Right;Left    Right Hip Flexion 4+/5    Right Hip Extension 4+/5    Right Hip ABduction 4+/5    Left Hip Flexion 4+/5    Left Hip Extension 4+/5    Left Hip ABduction 4+/5    Right/Left Knee Right;Left    Right Knee Flexion 5/5    Right Knee Extension 5/5    Left Knee Flexion 5/5    Left Knee Extension 5/5      Palpation   Palpation comment TTP R proximal glute max, lower lumbar paraspinals, QL, hip abductors; no tenderness on R                        Objective measurements completed on examination: See above findings.       [facility] Adult PT Treatment/Exercise - 10/18/21 0001       Exercises   Exercises Lumbar      Lumbar Exercises: Stretches   Single Knee to Chest Stretch Right;Left;3 reps;30 seconds      Lumbar Exercises: Supine   Bridge 10 reps                     PT Education - 10/18/21 4097     Education Details Patient educated on exam findings, POC, scope of PT, HEP    Person(s) Educated Patient    Methods Explanation;Demonstration    Comprehension Verbalized understanding;Returned demonstration              PT Short Term Goals - 10/18/21 0943       PT SHORT TERM GOAL #1   Title Patient will be independent with HEP in order to improve functional outcomes.    Time 3    Period Weeks    Status New    Target Date 11/08/21      PT SHORT TERM GOAL #2   Title Patient will report at least 25% improvement in symptoms for improved quality of life.    Time  3    Period Weeks    Status New    Target Date 11/08/21               PT Long Term Goals - 10/18/21 0943       PT LONG TERM GOAL #1   Title Patient will report at least 75% improvement in symptoms for improved quality of life.    Time 6    Period Weeks    Status New    Target Date 11/29/21      PT LONG  TERM GOAL #2   Title Patient will improve FOTO score by at least 5 points in order to indicate improved tolerance to activity.    Time 6    Period Weeks    Status New    Target Date 11/29/21      PT LONG TERM GOAL #3   Title Patient will demonstrate at least 25% improvement in lumbar ROM in all restricted planes for improved ability to move trunk while completing chores.    Time 6    Period Weeks    Status New    Target Date 11/29/21      PT LONG TERM GOAL #4   Title Patient with demonstrate 5/5 hip MMT in order to demonstrate improve strength for ADL.    Time 6    Period Weeks    Status New    Target Date 11/29/21                    Plan - 10/18/21 0934     Clinical Impression Statement Patient is a 70 y.o. male who presents to physical therapy with c/o LBP. He presents with pain limited deficits in lumbar spine strength, ROM, endurance, postural impairments, spinal mobility and functional mobility with ADL. He is having to modify and restrict ADL as indicated by FOTO score as well as subjective information and objective measures which is affecting overall participation. Patient will benefit from skilled physical therapy in order to improve function and reduce impairment.    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, cervical fusion    Examination-Activity Limitations Transfers;Locomotion Level;Carry;Lift;Stand;Stairs;Squat    Examination-Participation Restrictions Cleaning;Shop;Volunteer;Yard Work;Community Activity    Stability/Clinical Decision Making Stable/Uncomplicated    Clinical Decision Making Low    Rehab Potential Good    PT Frequency 2x / week    PT Duration 6 weeks    PT Treatment/Interventions ADLs/Self Care Home Management;Cryotherapy;Aquatic Therapy;Electrical  Stimulation;Iontophoresis 4mg /ml Dexamethasone;Moist Heat;Traction;Ultrasound;DME Instruction;Gait training;Stair training;Functional mobility training;Therapeutic activities;Therapeutic exercise;Balance training;Neuromuscular re-education;Patient/family education;Orthotic Fit/Training;Manual techniques;Manual lymph drainage;Compression bandaging;Scar mobilization;Passive range of motion;Dry needling;Energy conservation;Splinting;Taping;Spinal Manipulations;Joint Manipulations    PT Next Visit Plan core and glute strength, progress as able; lumbar mobility    PT Home Exercise Plan bridge, SKTC    Consulted and Agree with Plan of Care Patient             Patient will benefit from skilled therapeutic intervention in order to improve the following deficits and impairments:  Difficulty walking, Decreased activity tolerance, Increased muscle spasms, Pain, Impaired flexibility, Improper body mechanics, Decreased strength, Decreased mobility  Visit Diagnosis: Low back pain, unspecified back pain laterality, unspecified chronicity, unspecified whether sciatica present  Muscle weakness (generalized)  Other abnormalities of gait and mobility  Other symptoms and signs involving the musculoskeletal system     Problem List Patient  Active Problem List   Diagnosis Date Noted   History of colonic polyps 07/18/2018   Rectal bleeding 07/18/2018   Abdominal pain 07/18/2018    9:46 AM, 10/18/21 Mearl Latin PT, DPT Physical Therapist at [facility] [place], [place], [address] Phone: [phone]   Fax:  [phone]  Name: Darrell Young MRN: [id number] Date of Birth: 07/27/1951

## 2021-10-18 NOTE — Patient Instructions (Signed)
Access Code: [email or web address] URL: [email or web address] Date: 10/18/2021 Prepared by: Mitzi Hansen Kateena Degroote  Exercises Hooklying Single Knee to Chest Stretch - 1 x daily - 7 x weekly - 3 reps - 20 second hold Supine Bridge - 1 x daily - 7 x weekly - 2 sets - 10 reps

## 2021-10-25 ENCOUNTER — Ambulatory Visit (HOSPITAL_COMMUNITY): Payer: No Typology Code available for payment source | Attending: Nurse Practitioner | Admitting: Physical Therapy

## 2021-10-25 ENCOUNTER — Other Ambulatory Visit: Payer: Self-pay

## 2021-10-25 ENCOUNTER — Encounter (HOSPITAL_COMMUNITY): Payer: Self-pay | Admitting: Physical Therapy

## 2021-10-25 DIAGNOSIS — M6281 Muscle weakness (generalized): Secondary | ICD-10-CM | POA: Diagnosis present

## 2021-10-25 DIAGNOSIS — M545 Low back pain, unspecified: Secondary | ICD-10-CM | POA: Insufficient documentation

## 2021-10-25 DIAGNOSIS — R29898 Other symptoms and signs involving the musculoskeletal system: Secondary | ICD-10-CM

## 2021-10-25 DIAGNOSIS — R2689 Other abnormalities of gait and mobility: Secondary | ICD-10-CM | POA: Insufficient documentation

## 2021-10-25 NOTE — Therapy (Signed)
[place], [place], [address] Phone: [phone]   Fax:  [phone]  Physical Therapy Treatment  Patient Details  Name: Darrell Young MRN: [id number] Date of Birth: [birth year] Referring Provider (PT): [name] NP   Encounter Date: 10/25/2021   PT End of Session - 10/25/21 2706     Visit Number 2    Number of Visits 12    Date for PT Re-Evaluation 11/29/21    Authorization Type [facility] community [facility] (15 visits within 120 days) Secondary : Medicare    Progress Note Due on Visit 10    PT Start Time 0832    PT Stop Time 0910    PT Time Calculation (min) 38 min    Activity Tolerance Patient tolerated treatment well    Behavior During Therapy [facility] for tasks assessed/performed             Past Medical History:  Diagnosis Date   Asthma    Chronic cough    Chronic sinusitis    COPD (chronic obstructive pulmonary disease) ([place])    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   Hyperlipidemia    Lung disease     Past Surgical History:  Procedure Laterality Date   COLONOSCOPY N/A 08/27/2018   Procedure: COLONOSCOPY;  Surgeon: Daneil Dolin, MD;  [facility];  Service: Endoscopy;  Laterality: N/A;  7:30am   ELBOW SURGERY Bilateral    patient states 124 bone spurs removed   POLYPECTOMY  08/27/2018   Procedure: POLYPECTOMY;  Surgeon: Daneil Dolin, MD;  [facility];  Service: Endoscopy;;  rexctal polyp times 2 cs   ROTATOR CUFF REPAIR Right    TOE AMPUTATION     TUMOR REMOVAL     back of neck   UMBILICAL HERNIA REPAIR      There were no vitals filed for this visit.   Subjective Assessment - 10/25/21 0833     Subjective His exercises went alright. His back feels about the same.    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 Yes    Pain Score 5     Pain Location Back    Pain Orientation Lower    Pain Descriptors / Indicators Tightness    Pain Type Chronic pain    Pain Onset More than a month ago    Pain Frequency Constant                               [facility] Adult PT Treatment/Exercise - 10/25/21 0001       Lumbar Exercises: Stretches   Single Knee to Chest Stretch Right;Left;3 reps;30 seconds      Lumbar Exercises: Standing   Other Standing Lumbar Exercises palof 1x10 bilateral green band      Lumbar Exercises: Supine   Bridge 10 reps    Bridge Limitations 2 sets; 10 second holds    Other Supine Lumbar Exercises DKTC with feet on green ball 1x 10 5 second holds                     PT Education - 10/25/21 2376  Education Details HEP    Person(s) Educated Patient    Methods Explanation;Demonstration    Comprehension Verbalized understanding;Returned demonstration              PT Short Term Goals - 10/18/21 0943       PT SHORT TERM GOAL #1   Title Patient will be independent with HEP in order to improve functional outcomes.    Time 3    Period Weeks    Status New    Target Date 11/08/21      PT SHORT TERM GOAL #2   Title Patient will report at least 25% improvement in symptoms for improved quality of life.    Time 3    Period Weeks    Status New    Target Date 11/08/21               PT Long Term Goals - 10/18/21 0943       PT LONG TERM GOAL #1   Title Patient will report at least 75% improvement in symptoms for improved quality of life.    Time 6    Period Weeks    Status New    Target Date 11/29/21      PT LONG TERM GOAL #2   Title Patient will improve FOTO score by at least 5 points in order to indicate improved tolerance to activity.    Time 6    Period Weeks    Status New    Target Date 11/29/21      PT LONG TERM GOAL #3   Title Patient will  demonstrate at least 25% improvement in lumbar ROM in all restricted planes for improved ability to move trunk while completing chores.    Time 6    Period Weeks    Status New    Target Date 11/29/21      PT LONG TERM GOAL #4   Title Patient with demonstrate 5/5 hip MMT in order to demonstrate improve strength for ADL.    Time 6    Period Weeks    Status New    Target Date 11/29/21                   Plan - 10/25/21 8676     Clinical Impression Statement Patient able to complete previous exercises with minimum cueing for mechanics and positioning. Began core and hip strengthening today which is tolerated well. Patient prefers to complete exercises with holds and requires increased time to complete. Patient with abdominal bulging with transitions, educated on possible hernia. Patient will continue to benefit from skilled physical therapy in order to reduce impairment and improve function.    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, cervical fusion    Examination-Activity Limitations Transfers;Locomotion Level;Carry;Lift;Stand;Stairs;Squat    Examination-Participation Restrictions Cleaning;Shop;Volunteer;Yard Work;Community Activity    Stability/Clinical Decision Making Stable/Uncomplicated    Rehab Potential Good    PT Frequency 2x / week    PT Duration 6 weeks    PT Treatment/Interventions ADLs/Self Care Home Management;Cryotherapy;Aquatic Therapy;Electrical Stimulation;Iontophoresis 4mg /ml Dexamethasone;Moist Heat;Traction;Ultrasound;DME Instruction;Gait training;Stair training;Functional mobility training;Therapeutic activities;Therapeutic exercise;Balance training;Neuromuscular re-education;Patient/family education;Orthotic Fit/Training;Manual techniques;Manual lymph drainage;Compression  bandaging;Scar mobilization;Passive range of motion;Dry needling;Energy conservation;Splinting;Taping;Spinal Manipulations;Joint Manipulations    PT Next Visit Plan core and glute strength, progress as able; lumbar mobility    PT Home Exercise Plan bridge, [facility]  Consulted and Agree with Plan of Care Patient             Patient will benefit from skilled therapeutic intervention in order to improve the following deficits and impairments:  Difficulty walking, Decreased activity tolerance, Increased muscle spasms, Pain, Impaired flexibility, Improper body mechanics, Decreased strength, Decreased mobility  Visit Diagnosis: Low back pain, unspecified back pain laterality, unspecified chronicity, unspecified whether sciatica present  Muscle weakness (generalized)  Other abnormalities of gait and mobility  Other symptoms and signs involving the musculoskeletal system     Problem List Patient Active Problem List   Diagnosis Date Noted   History of colonic polyps 07/18/2018   Rectal bleeding 07/18/2018   Abdominal pain 07/18/2018    9:09 AM, 10/25/21 Mearl Latin PT, DPT Physical Therapist at [place], [place], [address] Phone: [phone]   Fax:  [phone]  Name: Darrell Young MRN: [id number] Date of Birth: [birth year]

## 2021-10-27 ENCOUNTER — Ambulatory Visit (HOSPITAL_COMMUNITY): Payer: No Typology Code available for payment source

## 2021-10-27 ENCOUNTER — Encounter (HOSPITAL_COMMUNITY): Payer: Self-pay

## 2021-10-27 ENCOUNTER — Other Ambulatory Visit: Payer: Self-pay

## 2021-10-27 DIAGNOSIS — R2689 Other abnormalities of gait and mobility: Secondary | ICD-10-CM

## 2021-10-27 DIAGNOSIS — M545 Low back pain, unspecified: Secondary | ICD-10-CM

## 2021-10-27 DIAGNOSIS — M6281 Muscle weakness (generalized): Secondary | ICD-10-CM

## 2021-10-27 NOTE — Patient Instructions (Addendum)
Abdominals: Single Leg Bend    Lying on back with legs bent, do abdominal set, inhale, then exhale while slowly sliding heel along floor toward buttocks. Slowly return to starting position. Repeat _10-15_ times each leg per set. Do _1___ sets per session. Do _1_ sessions per day.  Copyright  VHI. All rights reserved.   Knee to Chest: Advanced    Lie with one leg straight, one bent. Bring bent knee up. Be sure pelvis does not tilt or rotate. Lift knee _10-15__ times alternating legs. Restabilize pelvis aftger each. Repeat with other leg. Do ___ sets, ___ times per day.  http://ss.exer.us/10   Copyright  VHI. All rights reserved.   Abdominal Lying    Lie on abdomen, pillows as needed under hips, ankles, forehead and chest. Rest forehead on hands or on folded towel as needed and/or instructed by your therapist. Lie _2-5_ minutes.  Copyright  VHI. All rights reserved.    Knee Roll    Lying on back, with knees bent and feet flat on floor, arms outstretched to sides, slowly roll both knees to side, hold 30 seconds. Back to starting position. Then to opposite side, hold 30 seconds. Return to starting position. Keep shoulders and arms in contact with floor. You can raise opposite arm over and out to side to increase rotation stretch. Repeat 2-3 times on each side.   Copyright  VHI. All rights reserved.

## 2021-10-27 NOTE — Therapy (Signed)
[place], [place], [address] Phone: [phone]   Fax:  [phone]  Physical Therapy Treatment  Patient Details  Name: Darrell Young MRN: [id number] Date of Birth: 16-Aug-1951 Referring Provider (PT): [name] NP   Encounter Date: 10/27/2021   PT End of Session - 10/27/21 0959     Visit Number 3    Number of Visits 12    Date for PT Re-Evaluation 11/29/21    Authorization Type Primary [facility] community care (15 visits within 120 days) Secondary : Medicare    Progress Note Due on Visit 10    PT Start Time 0934    PT Stop Time 1017    PT Time Calculation (min) 43 min    Activity Tolerance Patient tolerated treatment well    Behavior During Therapy [facility] for tasks assessed/performed             Past Medical History:  Diagnosis Date   Asthma    Chronic cough    Chronic sinusitis    COPD (chronic obstructive pulmonary disease) ([place])    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   Hyperlipidemia    Lung disease     Past Surgical History:  Procedure Laterality Date   COLONOSCOPY N/A 08/27/2018   Procedure: COLONOSCOPY;  Surgeon: Daneil Dolin, MD;  [facility];  Service: Endoscopy;  Laterality: N/A;  7:30am   ELBOW SURGERY Bilateral    patient states 124 bone spurs removed   POLYPECTOMY  08/27/2018   Procedure: POLYPECTOMY;  Surgeon: Daneil Dolin, MD;  [facility];  Service: Endoscopy;;  rexctal polyp times 2 cs   ROTATOR CUFF REPAIR Right    TOE AMPUTATION     TUMOR REMOVAL     back of neck   UMBILICAL HERNIA REPAIR      There were no vitals filed for this visit.   Subjective Assessment - 10/27/21 1223     Subjective Has been performing HEP. Wonders who he would talk to about the bump that comes out on his belly. No real change in pain yet.    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 Yes    Pain Score 6     Pain Location Back    Pain Orientation Right;Posterior;Lateral    Pain Descriptors / Indicators Dull    Pain Onset More than a month ago               [facility] Adult PT Treatment/Exercise - 10/27/21 0001       Lumbar Exercises: Supine   Ab Set 15 reps;5 seconds    Heel Slides 10 reps   ab set   Bent Knee Raise 10 reps;5 seconds   ab set   Bridge 10 reps;5 seconds    Other Supine Lumbar Exercises log roll transfers                     PT Education - 10/27/21 1015     Education Details HEP, log roll    Person(s) Educated Patient    Methods Explanation;Handout    Comprehension Verbalized understanding              PT  Short Term Goals - 10/27/21 0959       PT SHORT TERM GOAL #1   Title Patient will be independent with HEP in order to improve functional outcomes.    Time 3    Period Weeks    Status On-going    Target Date 11/08/21      PT SHORT TERM GOAL #2   Title Patient will report at least 25% improvement in symptoms for improved quality of life.    Time 3    Period Weeks    Status On-going    Target Date 11/08/21               PT Long Term Goals - 10/27/21 0959       PT LONG TERM GOAL #1   Title Patient will report at least 75% improvement in symptoms for improved quality of life.    Time 6    Period Weeks    Status On-going      PT LONG TERM GOAL #2   Title Patient will improve FOTO score by at least 5 points in order to indicate improved tolerance to activity.    Time 6    Period Weeks    Status On-going      PT LONG TERM GOAL #3   Title Patient will demonstrate at least 25% improvement in lumbar ROM in all restricted planes for improved ability to move trunk while completing chores.    Time 6    Period Weeks    Status On-going      PT LONG TERM GOAL #4    Title Patient with demonstrate 5/5 hip MMT in order to demonstrate improve strength for ADL.    Time 6    Period Weeks    Status On-going                   Plan - 10/27/21 0959     Clinical Impression Statement Session focused on neuromuscular re-education of abdominal set. Potential abdominal hernia noted in area of linea alba. Educated on log roll with patient noting a difference in feel of abdomen. Discussed consulting with [place] practitioner regarding an assessment of his potential abdominal hernia. Performed exercises with emphasis on abdominal set first. Added heel slide and bent knee raise, as well as stretches for trunk motion in prone lying and supine lower trunk rotation with opposite arm out to side to increase anterior chest stretch and thoracic rotation. Patient responded well to treatment stating his pain was the same but he felt looser at end of session. Patient will continue to benefit from skilled physical therapy in order to reduce impairment and improve function.    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, cervical fusion    Examination-Activity Limitations Transfers;Locomotion Level;Carry;Lift;Stand;Stairs;Squat    Examination-Participation Restrictions Cleaning;Shop;Volunteer;Yard Work;Community Activity    Stability/Clinical Decision Making Stable/Uncomplicated    Rehab Potential Good    PT Frequency 2x / week    PT Duration 6 weeks    PT Treatment/Interventions ADLs/Self Care Home Management;Cryotherapy;Aquatic Therapy;Electrical Stimulation;Iontophoresis 4mg /ml Dexamethasone;Moist Heat;Traction;Ultrasound;DME Instruction;Gait training;Stair training;Functional mobility training;Therapeutic activities;Therapeutic exercise;Balance training;Neuromuscular  re-education;Patient/family education;Orthotic Fit/Training;Manual techniques;Manual lymph drainage;Compression bandaging;Scar mobilization;Passive range of motion;Dry needling;Energy conservation;Splinting;Taping;Spinal Manipulations;Joint Manipulations    PT Next Visit Plan core and glute strength, progress as able; lumbar mobility    PT Home Exercise Plan bridge, [facility]; 11/3  BKR; 11/3 BKR, prone lying, LTR, heel slide    Consulted and Agree with Plan of Care Patient             Patient will benefit from skilled therapeutic intervention in order to improve the following deficits and impairments:  Difficulty walking, Decreased activity tolerance, Increased muscle spasms, Pain, Impaired flexibility, Improper body mechanics, Decreased strength, Decreased mobility  Visit Diagnosis: Low back pain, unspecified back pain laterality, unspecified chronicity, unspecified whether sciatica present  Muscle weakness (generalized)  Other abnormalities of gait and mobility     Problem List Patient Active Problem List   Diagnosis Date Noted   History of colonic polyps 07/18/2018   Rectal bleeding 07/18/2018   Abdominal pain 07/18/2018   Floria Raveling. Hartnett-Rands, MS, PT [facility] [place] [phone]  Jeannie Done, PT 10/27/2021, 12:41 PM  [place] [address] [place], [place], [address] Phone: [phone]   Fax:  [phone]  Name: Phenix Vandermeulen MRN: [id number] Date of Birth: 1951/12/12

## 2021-11-02 ENCOUNTER — Other Ambulatory Visit: Payer: Self-pay

## 2021-11-02 ENCOUNTER — Encounter (HOSPITAL_COMMUNITY): Payer: Self-pay | Admitting: Physical Therapy

## 2021-11-02 ENCOUNTER — Ambulatory Visit (HOSPITAL_COMMUNITY): Payer: No Typology Code available for payment source | Admitting: Physical Therapy

## 2021-11-02 DIAGNOSIS — R29898 Other symptoms and signs involving the musculoskeletal system: Secondary | ICD-10-CM

## 2021-11-02 DIAGNOSIS — M6281 Muscle weakness (generalized): Secondary | ICD-10-CM

## 2021-11-02 DIAGNOSIS — M545 Low back pain, unspecified: Secondary | ICD-10-CM | POA: Diagnosis not present

## 2021-11-02 DIAGNOSIS — R2689 Other abnormalities of gait and mobility: Secondary | ICD-10-CM

## 2021-11-02 NOTE — Therapy (Signed)
[place], [place], [address] Phone: [phone]   Fax:  [phone]  Physical Therapy Treatment  Patient Details  Name: Darrell Young MRN: [id number] Date of Birth: 19-May-1951 Referring Provider (PT): [name] NP   Encounter Date: 11/02/2021   PT End of Session - 11/02/21 0748     Visit Number 4    Number of Visits 12    Date for PT Re-Evaluation 11/29/21    Authorization Type Primary [facility] community care (15 visits within 120 days) Secondary : Medicare    Progress Note Due on Visit 10    PT Start Time 0748    PT Stop Time 0826    PT Time Calculation (min) 38 min    Activity Tolerance Patient tolerated treatment well    Behavior During Therapy [facility] for tasks assessed/performed             Past Medical History:  Diagnosis Date   Asthma    Chronic cough    Chronic sinusitis    COPD (chronic obstructive pulmonary disease) ([facility])    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   Hyperlipidemia    Lung disease     Past Surgical History:  Procedure Laterality Date   COLONOSCOPY N/A 08/27/2018   Procedure: COLONOSCOPY;  Surgeon: Daneil Dolin, MD;  [facility];  Service: Endoscopy;  Laterality: N/A;  7:30am   ELBOW SURGERY Bilateral    patient states 124 bone spurs removed   POLYPECTOMY  08/27/2018   Procedure: POLYPECTOMY;  Surgeon: Daneil Dolin, MD;  [facility];  Service: Endoscopy;;  rexctal polyp times 2 cs   ROTATOR CUFF REPAIR Right    TOE AMPUTATION     TUMOR REMOVAL     back of neck   UMBILICAL HERNIA REPAIR      There were no vitals filed for this visit.   Subjective Assessment - 11/02/21 0749     Subjective Patient states his back is doing good. He feels that what was done last time was helpful.    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 Yes    Pain Score 4     Pain Location Back    Pain Orientation Lower    Pain Descriptors / Indicators Dull    Pain Type Chronic pain    Pain Onset More than a month ago                               [facility] Adult PT Treatment/Exercise - 11/02/21 0001       Lumbar Exercises: Seated   Sit to Stand 10 reps      Lumbar Exercises: Supine   Ab Set 15 reps;5 seconds    Heel Slides 10 reps   with Ab sets   Bridge 10 reps;5 seconds    Other Supine Lumbar Exercises DKTC with green ball x 10      Lumbar Exercises: Prone   Straight Leg Raise 10 reps    Straight Leg Raises Limitations bilateral  PT Education - 11/02/21 0748     Education Details HEP    Person(s) Educated Patient    Methods Explanation;Demonstration    Comprehension Verbalized understanding;Returned demonstration              PT Short Term Goals - 10/27/21 0959       PT SHORT TERM GOAL #1   Title Patient will be independent with HEP in order to improve functional outcomes.    Time 3    Period Weeks    Status On-going    Target Date 11/08/21      PT SHORT TERM GOAL #2   Title Patient will report at least 25% improvement in symptoms for improved quality of life.    Time 3    Period Weeks    Status On-going    Target Date 11/08/21               PT Long Term Goals - 10/27/21 0959       PT LONG TERM GOAL #1   Title Patient will report at least 75% improvement in symptoms for improved quality of life.    Time 6    Period Weeks    Status On-going      PT LONG TERM GOAL #2   Title Patient will improve FOTO score by at least 5 points in order to indicate improved tolerance to activity.    Time 6    Period Weeks    Status On-going      PT LONG TERM GOAL #3   Title Patient will demonstrate at least 25% improvement in lumbar ROM in all  restricted planes for improved ability to move trunk while completing chores.    Time 6    Period Weeks    Status On-going      PT LONG TERM GOAL #4   Title Patient with demonstrate 5/5 hip MMT in order to demonstrate improve strength for ADL.    Time 6    Period Weeks    Status On-going                   Plan - 11/02/21 9924     Clinical Impression Statement Continued with core and glute strengthening today. Patient requires minimal cueing for TRA activation and breathing with exercises with good carry over. He completes exercises with slow controlled movements. Patient will continue to benefit from skilled physical therapy in order to reduce impairment and improve function.    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, cervical fusion    Examination-Activity Limitations Transfers;Locomotion Level;Carry;Lift;Stand;Stairs;Squat    Examination-Participation Restrictions Cleaning;Shop;Volunteer;Yard Work;Community Activity    Stability/Clinical Decision Making Stable/Uncomplicated    Rehab Potential Good    PT Frequency 2x / week    PT Duration 6 weeks    PT Treatment/Interventions ADLs/Self Care Home Management;Cryotherapy;Aquatic Therapy;Electrical Stimulation;Iontophoresis 4mg /ml Dexamethasone;Moist Heat;Traction;Ultrasound;DME Instruction;Gait training;Stair training;Functional mobility training;Therapeutic activities;Therapeutic exercise;Balance training;Neuromuscular re-education;Patient/family education;Orthotic Fit/Training;Manual techniques;Manual lymph drainage;Compression bandaging;Scar mobilization;Passive range of motion;Dry needling;Energy conservation;Splinting;Taping;Spinal Manipulations;Joint Manipulations    PT Next Visit Plan core and glute strength, progress as able; lumbar  mobility    PT Home Exercise Plan bridge, [facility]; 11/3 BKR; 11/3 BKR, prone lying, LTR, heel slide    Consulted and Agree with Plan of Care Patient             Patient will benefit  from skilled therapeutic intervention in order to improve the following deficits and impairments:  Difficulty walking, Decreased activity tolerance, Increased muscle spasms, Pain, Impaired flexibility, Improper body mechanics, Decreased strength, Decreased mobility  Visit Diagnosis: Low back pain, unspecified back pain laterality, unspecified chronicity, unspecified whether sciatica present  Muscle weakness (generalized)  Other abnormalities of gait and mobility  Other symptoms and signs involving the musculoskeletal system     Problem List Patient Active Problem List   Diagnosis Date Noted   History of colonic polyps 07/18/2018   Rectal bleeding 07/18/2018   Abdominal pain 07/18/2018   8:23 AM, 11/02/21 Mearl Latin PT, DPT Physical Therapist at [place], [place], [address] Phone: [phone]   Fax:  [phone]  Name: Darrell Young MRN: [id number] Date of Birth: 12-11-1951

## 2021-11-04 ENCOUNTER — Telehealth (HOSPITAL_COMMUNITY): Payer: Self-pay

## 2021-11-04 ENCOUNTER — Encounter (HOSPITAL_COMMUNITY): Payer: No Typology Code available for payment source

## 2021-11-04 NOTE — Telephone Encounter (Signed)
No call, no show.  11:52 AM, 11/04/21 M. Sherlyn Lees, PT, DPT Physical Therapist- [place] Office Number: [phone]

## 2021-11-08 ENCOUNTER — Other Ambulatory Visit: Payer: Self-pay

## 2021-11-08 ENCOUNTER — Encounter (HOSPITAL_COMMUNITY): Payer: Self-pay | Admitting: Physical Therapy

## 2021-11-08 ENCOUNTER — Ambulatory Visit (HOSPITAL_COMMUNITY): Payer: No Typology Code available for payment source | Admitting: Physical Therapy

## 2021-11-08 DIAGNOSIS — M545 Low back pain, unspecified: Secondary | ICD-10-CM | POA: Diagnosis not present

## 2021-11-08 DIAGNOSIS — R29898 Other symptoms and signs involving the musculoskeletal system: Secondary | ICD-10-CM

## 2021-11-08 DIAGNOSIS — M6281 Muscle weakness (generalized): Secondary | ICD-10-CM

## 2021-11-08 DIAGNOSIS — R2689 Other abnormalities of gait and mobility: Secondary | ICD-10-CM

## 2021-11-08 NOTE — Therapy (Signed)
[place], [place], [address] Phone: [phone]   Fax:  [phone]  Physical Therapy Treatment  Patient Details  Name: Darrell Young MRN: [id number] Date of Birth: 1951-02-16 Referring Provider (PT): [name] NP   Encounter Date: 11/08/2021   PT End of Session - 11/08/21 0835     Visit Number 5    Number of Visits 12    Date for PT Re-Evaluation 11/29/21    Authorization Type [facility] (15 visits within 120 days) Secondary : Medicare    Progress Note Due on Visit 10    PT Start Time 0833    PT Stop Time 0911    PT Time Calculation (min) 38 min    Activity Tolerance Patient tolerated treatment well    Behavior During Therapy [facility] for tasks assessed/performed             Past Medical History:  Diagnosis Date   Asthma    Chronic cough    Chronic sinusitis    COPD (chronic obstructive pulmonary disease) (HCC)    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   Hyperlipidemia    Lung disease     Past Surgical History:  Procedure Laterality Date   COLONOSCOPY N/A 08/27/2018   Procedure: COLONOSCOPY;  Surgeon: Daneil Dolin, MD;  [facility];  Service: Endoscopy;  Laterality: N/A;  7:30am   ELBOW SURGERY Bilateral    patient states 124 bone spurs removed   POLYPECTOMY  08/27/2018   Procedure: POLYPECTOMY;  Surgeon: Daneil Dolin, MD;  [facility];  Service: Endoscopy;;  rexctal polyp times 2 cs   ROTATOR CUFF REPAIR Right    TOE AMPUTATION     TUMOR REMOVAL     back of neck   UMBILICAL HERNIA REPAIR      There were no vitals filed for this visit.   Subjective Assessment - 11/08/21 0835     Subjective Feels back is getting better. his home exercises are going well.    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 Yes    Pain Score 5     Pain Location Back    Pain Orientation Lower    Pain Descriptors / Indicators Dull    Pain Type Chronic pain    Pain Onset More than a month ago    Pain Frequency Constant                               [facility] Adult PT Treatment/Exercise - 11/08/21 0001       Lumbar Exercises: Standing   Other Standing Lumbar Exercises palof 2x10 bilateral green band      Lumbar Exercises: Supine   Bridge 10 reps;5 seconds    Other Supine Lumbar Exercises DKTC with green ball x 10    Other Supine Lumbar Exercises LTR with LE on green ball x 10 bilateral 5 second holds      Lumbar Exercises: Sidelying   Hip Abduction Both;10 reps    Hip Abduction Limitations 2 sets      Lumbar Exercises: Prone   Straight Leg Raise 10 reps  Straight Leg Raises Limitations bilateral, 3 sets                     PT Education - 11/08/21 0835     Education Details HEP    Person(s) Educated Patient    Methods Explanation;Demonstration    Comprehension Verbalized understanding;Returned demonstration              PT Short Term Goals - 10/27/21 0959       PT SHORT TERM GOAL #1   Title Patient will be independent with HEP in order to improve functional outcomes.    Time 3    Period Weeks    Status On-going    Target Date 11/08/21      PT SHORT TERM GOAL #2   Title Patient will report at least 25% improvement in symptoms for improved quality of life.    Time 3    Period Weeks    Status On-going    Target Date 11/08/21               PT Long Term Goals - 10/27/21 0959       PT LONG TERM GOAL #1   Title Patient will report at least 75% improvement in symptoms for improved quality of life.    Time 6    Period Weeks    Status On-going      PT LONG TERM GOAL #2   Title Patient will improve FOTO score by at least 5 points in order to indicate improved  tolerance to activity.    Time 6    Period Weeks    Status On-going      PT LONG TERM GOAL #3   Title Patient will demonstrate at least 25% improvement in lumbar ROM in all restricted planes for improved ability to move trunk while completing chores.    Time 6    Period Weeks    Status On-going      PT LONG TERM GOAL #4   Title Patient with demonstrate 5/5 hip MMT in order to demonstrate improve strength for ADL.    Time 6    Period Weeks    Status On-going                   Plan - 11/08/21 0835     Clinical Impression Statement Continued with core and hip strength and lumbar mobility exercises without increase in symptoms. Intermittent cueing for mechanics and TRA activation with good carry over. Patient notes moderate to high fatigue in hips following table glute strength exercises secondary to impaired endurance. Patient will continue to benefit from skilled physical therapy in order to reduce impairment and improve function.    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, cervical fusion    Examination-Activity Limitations Transfers;Locomotion Level;Carry;Lift;Stand;Stairs;Squat    Examination-Participation Restrictions Cleaning;Shop;Volunteer;Yard Work;Community Activity    Stability/Clinical Decision Making Stable/Uncomplicated    Rehab Potential Good    PT Frequency 2x / week    PT Duration 6 weeks    PT Treatment/Interventions ADLs/Self Care Home Management;Cryotherapy;Aquatic Therapy;Electrical Stimulation;Iontophoresis 4mg /ml Dexamethasone;Moist Heat;Traction;Ultrasound;DME Instruction;Gait training;Stair training;Functional mobility training;Therapeutic activities;Therapeutic exercise;Balance training;Neuromuscular re-education;Patient/family education;Orthotic  Fit/Training;Manual techniques;Manual lymph drainage;Compression bandaging;Scar mobilization;Passive range of motion;Dry needling;Energy conservation;Splinting;Taping;Spinal Manipulations;Joint Manipulations    PT Next Visit Plan core and glute strength, progress as able; lumbar mobility    PT  Home Exercise Plan bridge, [facility]; 11/3 BKR; 11/3 BKR, prone lying, LTR, heel slide 11/15 hip abd/ ext    Consulted and Agree with Plan of Care Patient             Patient will benefit from skilled therapeutic intervention in order to improve the following deficits and impairments:  Difficulty walking, Decreased activity tolerance, Increased muscle spasms, Pain, Impaired flexibility, Improper body mechanics, Decreased strength, Decreased mobility  Visit Diagnosis: Low back pain, unspecified back pain laterality, unspecified chronicity, unspecified whether sciatica present  Muscle weakness (generalized)  Other abnormalities of gait and mobility  Other symptoms and signs involving the musculoskeletal system     Problem List Patient Active Problem List   Diagnosis Date Noted   History of colonic polyps 07/18/2018   Rectal bleeding 07/18/2018   Abdominal pain 07/18/2018    9:08 AM, 11/08/21 Mearl Latin PT, DPT Physical Therapist at [place], [place], [address] Phone: [phone]   Fax:  [phone]  Name: Darrell Young MRN: [id number] Date of Birth: [birth year]

## 2021-11-08 NOTE — Patient Instructions (Signed)
Access Code: [email or web address] URL: [email or web address] Date: 11/08/2021 Prepared by: Mitzi Hansen Tenisha Fleece  Exercises Prone Hip Extension - 1 x daily - 7 x weekly - 3 sets - 10 reps Sidelying Hip Abduction - 1 x daily - 7 x weekly - 3 sets - 10 reps

## 2021-11-10 ENCOUNTER — Other Ambulatory Visit: Payer: Self-pay

## 2021-11-10 ENCOUNTER — Ambulatory Visit (HOSPITAL_COMMUNITY): Payer: No Typology Code available for payment source | Admitting: Physical Therapy

## 2021-11-10 ENCOUNTER — Encounter (HOSPITAL_COMMUNITY): Payer: Self-pay | Admitting: Physical Therapy

## 2021-11-10 DIAGNOSIS — M6281 Muscle weakness (generalized): Secondary | ICD-10-CM

## 2021-11-10 DIAGNOSIS — R29898 Other symptoms and signs involving the musculoskeletal system: Secondary | ICD-10-CM

## 2021-11-10 DIAGNOSIS — R2689 Other abnormalities of gait and mobility: Secondary | ICD-10-CM

## 2021-11-10 DIAGNOSIS — M545 Low back pain, unspecified: Secondary | ICD-10-CM

## 2021-11-10 NOTE — Therapy (Signed)
[place], [place], [address] Phone: [phone]   Fax:  [phone]  Physical Therapy Treatment  Patient Details  Name: Darrell Young MRN: [id number] Date of Birth: [birth year] Referring Provider (PT): [name] NP   Encounter Date: 11/10/2021   PT End of Session - 11/10/21 0748     Visit Number 6    Number of Visits 12    Date for PT Re-Evaluation 11/29/21    Authorization Type [facility] (15 visits within 120 days) Secondary : Medicare    Progress Note Due on Visit 10    PT Start Time 0748    PT Stop Time 0826    PT Time Calculation (min) 38 min    Activity Tolerance Patient tolerated treatment well    Behavior During Therapy 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   Hyperlipidemia    Lung disease     Past Surgical History:  Procedure Laterality Date   COLONOSCOPY N/A 08/27/2018   Procedure: COLONOSCOPY;  Surgeon: Daneil Dolin, MD;  [facility];  Service: Endoscopy;  Laterality: N/A;  7:30am   ELBOW SURGERY Bilateral    patient states 124 bone spurs removed   POLYPECTOMY  08/27/2018   Procedure: POLYPECTOMY;  Surgeon: Daneil Dolin, MD;  [facility];  Service: Endoscopy;;  rexctal polyp times 2 cs   ROTATOR CUFF REPAIR Right    TOE AMPUTATION     TUMOR REMOVAL     back of neck   UMBILICAL HERNIA REPAIR      There were no vitals filed for this visit.   Subjective Assessment - 11/10/21 0749     Subjective Patient states a little sore in his hips from exercises but has been working on them.    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 Yes    Pain Score 5     Pain Location Back    Pain Orientation Lower    Pain Descriptors / Indicators Sore    Pain Type Chronic pain    Pain Onset More than a month ago                               [facility] Adult PT Treatment/Exercise - 11/10/21 0001       Lumbar Exercises: Supine   Other Supine Lumbar Exercises DKTC with green ball x 10    Other Supine Lumbar Exercises LTR with LE on green ball x 10 bilateral 5 second holds      Lumbar Exercises: Sidelying   Hip Abduction Both;10 reps    Hip Abduction Limitations 2 sets                     PT Education - 11/10/21 0749     Education Details HEP    Person(s) Educated Patient    Methods Explanation;Demonstration  Comprehension Verbalized understanding;Returned demonstration              PT Short Term Goals - 10/27/21 0959       PT SHORT TERM GOAL #1   Title Patient will be independent with HEP in order to improve functional outcomes.    Time 3    Period Weeks    Status On-going    Target Date 11/08/21      PT SHORT TERM GOAL #2   Title Patient will report at least 25% improvement in symptoms for improved quality of life.    Time 3    Period Weeks    Status On-going    Target Date 11/08/21               PT Long Term Goals - 10/27/21 0959       PT LONG TERM GOAL #1   Title Patient will report at least 75% improvement in symptoms for improved quality of life.    Time 6    Period Weeks    Status On-going      PT LONG TERM GOAL #2   Title Patient will improve FOTO score by at least 5 points in order to indicate improved tolerance to activity.    Time 6    Period Weeks    Status On-going      PT LONG TERM GOAL #3   Title Patient will demonstrate at least 25% improvement in lumbar ROM in all restricted planes for improved ability to move trunk while completing  chores.    Time 6    Period Weeks    Status On-going      PT LONG TERM GOAL #4   Title Patient with demonstrate 5/5 hip MMT in order to demonstrate improve strength for ADL.    Time 6    Period Weeks    Status On-going                   Plan - 11/10/21 0749     Clinical Impression Statement Continued with lumbar mobility and core/hip strengthening. He requires minimal to no cueing for mechanics with previously completed exercises. Patient continues to show impaired glute strength and endurance with quick fatigue with hip abduction. Patient will continue to benefit from skilled physical therapy in order to reduce impairment and improve function.    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, cervical fusion    Examination-Activity Limitations Transfers;Locomotion Level;Carry;Lift;Stand;Stairs;Squat    Examination-Participation Restrictions Cleaning;Shop;Volunteer;Yard Work;Community Activity    Stability/Clinical Decision Making Stable/Uncomplicated    Rehab Potential Good    PT Frequency 2x / week    PT Duration 6 weeks    PT Treatment/Interventions ADLs/Self Care Home Management;Cryotherapy;Aquatic Therapy;Electrical Stimulation;Iontophoresis 4mg /ml Dexamethasone;Moist Heat;Traction;Ultrasound;DME Instruction;Gait training;Stair training;Functional mobility training;Therapeutic activities;Therapeutic exercise;Balance training;Neuromuscular re-education;Patient/family education;Orthotic Fit/Training;Manual techniques;Manual lymph drainage;Compression bandaging;Scar mobilization;Passive range of motion;Dry needling;Energy conservation;Splinting;Taping;Spinal Manipulations;Joint Manipulations    PT Next Visit Plan core and glute strength, progress as able; lumbar mobility    PT Home Exercise  Plan bridge, [facility]; 11/3 BKR; 11/3 BKR, prone lying, LTR, heel slide 11/15 hip abd/ ext    Consulted and Agree with Plan of Care Patient             Patient will benefit from skilled therapeutic intervention in order to improve the following deficits and impairments:  Difficulty walking, Decreased  activity tolerance, Increased muscle spasms, Pain, Impaired flexibility, Improper body mechanics, Decreased strength, Decreased mobility  Visit Diagnosis: Low back pain, unspecified back pain laterality, unspecified chronicity, unspecified whether sciatica present  Muscle weakness (generalized)  Other abnormalities of gait and mobility  Other symptoms and signs involving the musculoskeletal system     Problem List Patient Active Problem List   Diagnosis Date Noted   History of colonic polyps 07/18/2018   Rectal bleeding 07/18/2018   Abdominal pain 07/18/2018    8:24 AM, 11/10/21 Mearl Latin PT, DPT Physical Therapist at [place], [place], [address] Phone: [phone]   Fax:  [phone]  Name: Darrell Young MRN: [id number] Date of Birth: 05-May-1951

## 2021-11-14 ENCOUNTER — Ambulatory Visit (HOSPITAL_COMMUNITY): Payer: No Typology Code available for payment source | Admitting: Physical Therapy

## 2021-11-16 ENCOUNTER — Ambulatory Visit (HOSPITAL_COMMUNITY): Payer: No Typology Code available for payment source | Admitting: Physical Therapy

## 2021-11-22 ENCOUNTER — Ambulatory Visit (HOSPITAL_COMMUNITY): Payer: No Typology Code available for payment source | Admitting: Physical Therapy

## 2021-11-22 ENCOUNTER — Other Ambulatory Visit: Payer: Self-pay

## 2021-11-22 ENCOUNTER — Encounter (HOSPITAL_COMMUNITY): Payer: Self-pay | Admitting: Physical Therapy

## 2021-11-22 DIAGNOSIS — M6281 Muscle weakness (generalized): Secondary | ICD-10-CM

## 2021-11-22 DIAGNOSIS — M545 Low back pain, unspecified: Secondary | ICD-10-CM | POA: Diagnosis not present

## 2021-11-22 DIAGNOSIS — R29898 Other symptoms and signs involving the musculoskeletal system: Secondary | ICD-10-CM

## 2021-11-22 DIAGNOSIS — R2689 Other abnormalities of gait and mobility: Secondary | ICD-10-CM

## 2021-11-22 NOTE — Therapy (Signed)
[place], [place], [address] Phone: [phone]   Fax:  [phone]  Physical Therapy Treatment  Patient Details  Name: Darrell Young MRN: [id number] Date of Birth: [birth year] Referring Provider (PT): [name] NP   Encounter Date: 11/22/2021   PT End of Session - 11/22/21 0748     Visit Number 7    Number of Visits 12    Date for PT Re-Evaluation 11/29/21    Authorization Type [facility] (15 visits within 120 days) Secondary : Medicare    Progress Note Due on Visit 10    PT Start Time 0748    PT Stop Time 0826    PT Time Calculation (min) 38 min    Activity Tolerance Patient tolerated treatment well    Behavior During Therapy [facility] for tasks assessed/performed             Past Medical History:  Diagnosis Date   Asthma    Chronic cough    Chronic sinusitis    COPD (chronic obstructive pulmonary disease) (HCC)    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   Hyperlipidemia    Lung disease     Past Surgical History:  Procedure Laterality Date   COLONOSCOPY N/A 08/27/2018   Procedure: COLONOSCOPY;  Surgeon: Daneil Dolin, MD;  [facility];  Service: Endoscopy;  Laterality: N/A;  7:30am   ELBOW SURGERY Bilateral    patient states 124 bone spurs removed   POLYPECTOMY  08/27/2018   Procedure: POLYPECTOMY;  Surgeon: Daneil Dolin, MD;  [facility];  Service: Endoscopy;;  rexctal polyp times 2 cs   ROTATOR CUFF REPAIR Right    TOE AMPUTATION     TUMOR REMOVAL     back of neck   UMBILICAL HERNIA REPAIR      There were no vitals filed for this visit.   Subjective Assessment - 11/22/21 0749     Subjective Patient states he has been busy. Back has been good.    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 Yes    Pain Score 4     Pain Orientation Lower    Pain Descriptors / Indicators Sore    Pain Type Chronic pain    Pain Onset More than a month ago                               [facility] Adult PT Treatment/Exercise - 11/22/21 0001       Lumbar Exercises: Standing   Row Both;15 reps    Theraband Level (Row) Level 3 (Green)    Row Limitations 2 sets    Shoulder Extension Both;15 reps    Theraband Level (Shoulder Extension) Level 3 (Green)    Shoulder Extension Limitations 2 sets    Other Standing Lumbar Exercises palof 2x10 bilateral green band      Lumbar Exercises: Supine   Bridge with clamshell 10 reps    Bridge with [employer] Limitations green band at [employer] Abdominal Isometric 5 reps;5 seconds    Large Ball Abdominal Isometric Limitations 2 sets    Other  Supine Lumbar Exercises DKTC with green ball x 10    Other Supine Lumbar Exercises LTR with LE on green ball x 10 bilateral 5 second holds                     PT Education - 11/22/21 0749     Education Details HEP    Person(s) Educated Patient    Methods Explanation;Demonstration    Comprehension Verbalized understanding;Returned demonstration              PT Short Term Goals - 10/27/21 0959       PT SHORT TERM GOAL #1   Title Patient will be independent with HEP in order to improve functional outcomes.    Time 3    Period Weeks    Status On-going    Target Date 11/08/21      PT SHORT TERM GOAL #2   Title Patient will report at least 25% improvement in symptoms for improved quality of life.    Time 3    Period Weeks    Status On-going    Target Date 11/08/21               PT Long Term Goals - 10/27/21 0959       PT LONG TERM GOAL #1   Title Patient will report at least 75% improvement in symptoms for improved quality of life.    Time 6    Period Weeks    Status  On-going      PT LONG TERM GOAL #2   Title Patient will improve FOTO score by at least 5 points in order to indicate improved tolerance to activity.    Time 6    Period Weeks    Status On-going      PT LONG TERM GOAL #3   Title Patient will demonstrate at least 25% improvement in lumbar ROM in all restricted planes for improved ability to move trunk while completing chores.    Time 6    Period Weeks    Status On-going      PT LONG TERM GOAL #4   Title Patient with demonstrate 5/5 hip MMT in order to demonstrate improve strength for ADL.    Time 6    Period Weeks    Status On-going                   Plan - 11/22/21 0748     Clinical Impression Statement Patient with decreased symptoms today compared to prior sessions. Continued with core strengthening and lumbar mobility exercises which are tolerated well. Patient given intermittent cueing for mechanics and positioning of exercises with good carry over. Patient notes minimal fatigue at end of session. Patient will continue to benefit from skilled physical therapy in order to reduce impairment and improve function.    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, cervical fusion    Examination-Activity Limitations Transfers;Locomotion Level;Carry;Lift;Stand;Stairs;Squat    Examination-Participation Restrictions Cleaning;Shop;Volunteer;Yard Work;Community Activity    Stability/Clinical Decision Making Stable/Uncomplicated    Rehab Potential Good    PT Frequency 2x / week    PT Duration 6 weeks    PT Treatment/Interventions ADLs/Self Care Home Management;Cryotherapy;Aquatic Therapy;Electrical Stimulation;Iontophoresis 4mg /ml Dexamethasone;Moist Heat;Traction;Ultrasound;DME Instruction;Gait training;Stair training;Functional mobility  training;Therapeutic activities;Therapeutic exercise;Balance training;Neuromuscular re-education;Patient/family education;Orthotic Fit/Training;Manual techniques;Manual lymph drainage;Compression bandaging;Scar mobilization;Passive range of motion;Dry needling;Energy conservation;Splinting;Taping;Spinal  Manipulations;Joint Manipulations    PT Next Visit Plan core and glute strength, progress as able; lumbar mobility    PT Home Exercise Plan bridge, [facility]; 11/3 BKR; 11/3 BKR, prone lying, LTR, heel slide 11/15 hip abd/ ext    Consulted and Agree with Plan of Care Patient             Patient will benefit from skilled therapeutic intervention in order to improve the following deficits and impairments:  Difficulty walking, Decreased activity tolerance, Increased muscle spasms, Pain, Impaired flexibility, Improper body mechanics, Decreased strength, Decreased mobility  Visit Diagnosis: Low back pain, unspecified back pain laterality, unspecified chronicity, unspecified whether sciatica present  Muscle weakness (generalized)  Other abnormalities of gait and mobility  Other symptoms and signs involving the musculoskeletal system     Problem List Patient Active Problem List   Diagnosis Date Noted   History of colonic polyps 07/18/2018   Rectal bleeding 07/18/2018   Abdominal pain 07/18/2018    8:22 AM, 11/22/21 Mearl Latin PT, DPT Physical Therapist at [place], [place], [address] Phone: [phone]   Fax:  [phone]  Name: Darrell Young MRN: [id number] Date of Birth: [birth year]

## 2021-11-24 ENCOUNTER — Encounter (HOSPITAL_COMMUNITY): Payer: No Typology Code available for payment source | Admitting: Physical Therapy

## 2021-11-28 ENCOUNTER — Other Ambulatory Visit: Payer: Self-pay

## 2021-11-28 ENCOUNTER — Encounter (HOSPITAL_COMMUNITY): Payer: Self-pay | Admitting: Physical Therapy

## 2021-11-28 ENCOUNTER — Ambulatory Visit (HOSPITAL_COMMUNITY): Payer: No Typology Code available for payment source | Attending: Nurse Practitioner | Admitting: Physical Therapy

## 2021-11-28 DIAGNOSIS — R29898 Other symptoms and signs involving the musculoskeletal system: Secondary | ICD-10-CM | POA: Insufficient documentation

## 2021-11-28 DIAGNOSIS — M6281 Muscle weakness (generalized): Secondary | ICD-10-CM | POA: Diagnosis present

## 2021-11-28 DIAGNOSIS — M545 Low back pain, unspecified: Secondary | ICD-10-CM | POA: Diagnosis present

## 2021-11-28 DIAGNOSIS — R2689 Other abnormalities of gait and mobility: Secondary | ICD-10-CM | POA: Insufficient documentation

## 2021-11-28 NOTE — Therapy (Signed)
[facility] [address] [place], [place], [address] Phone: [phone]   Fax:  [phone]  Physical Therapy Treatment/Discharge Summary  Patient Details  Name: Darrell Young MRN: [id number] Date of Birth: 1951/03/22 Referring Provider (PT): [name] NP   Encounter Date: 11/28/2021  PHYSICAL THERAPY DISCHARGE SUMMARY  Visits from Start of Care: 8  Current functional level related to goals / functional outcomes: See below   Remaining deficits:  See below   Education / Equipment: See below   Patient agrees to discharge. Patient goals were met. Patient is being discharged due to meeting the stated rehab goals.    PT End of Session - 11/28/21 1450     Visit Number 8    Number of Visits 12    Date for PT Re-Evaluation 11/29/21    [facility]ization Type [facility] (15 visits within 120 days) Secondary : Medicare    Progress Note Due on Visit 10    PT Start Time 1450    PT Stop Time 1505    PT Time Calculation (min) 15 min    Activity Tolerance Patient tolerated treatment well    Behavior During Therapy WFL for tasks assessed/performed             Past Medical History:  Diagnosis Date   Asthma    Chronic cough    Chronic sinusitis    COPD (chronic obstructive pulmonary disease) (HCC)    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   Hyperlipidemia    Lung disease     Past Surgical History:  Procedure Laterality Date   COLONOSCOPY N/A 08/27/2018   Procedure: COLONOSCOPY;  Surgeon: Daneil Dolin, MD;  [facility];  Service: Endoscopy;  Laterality: N/A;  7:30am   ELBOW SURGERY Bilateral    patient states 124 bone spurs removed   POLYPECTOMY  08/27/2018   Procedure: POLYPECTOMY;  Surgeon: Daneil Dolin, MD;  Location: [facility];  Service: Endoscopy;;  rexctal polyp times 2 cs   ROTATOR CUFF REPAIR Right    TOE AMPUTATION     TUMOR REMOVAL     back of  neck   UMBILICAL HERNIA REPAIR      There were no vitals filed for this visit.   Subjective Assessment - 11/28/21 1451     Subjective Patient states he really feels good. His home exercises are going well. Patient states 90% improvement with PT intervention. He feels like he is lacking some back strength still.    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 Yes    Pain Score 4     Pain Location Back    Pain Orientation Lower    Pain Descriptors / Indicators Sore    Pain Type Chronic pain    Pain Onset More than a month ago                [facility] PT Assessment - 11/28/21 0001       Assessment   Medical Diagnosis Lumbar spondylosis    Referring Provider (PT) [name] NP    Onset Date/Surgical Date 10/18/96    Prior Therapy yes      Precautions   Precautions None  Restrictions   Weight Bearing Restrictions No      Balance Screen   Has the patient fallen in the past 6 months No    Has the patient had a decrease in activity level because of a fear of falling?  No    Is the patient reluctant to leave their home because of a fear of falling?  No      Prior Function   Level of Independence Independent    Vocation Retired      Charity fundraiser Status Within Functional Limits for tasks assessed      Observation/Other Assessments   Observations Ambulates without AD    Focus on Therapeutic Outcomes (FOTO)  60% function      Sensation   Light Touch Appears Intact      Posture/Postural Control   Posture/Postural Control Postural limitations    Postural Limitations Decreased lumbar lordosis      AROM   Lumbar Flexion 0% limited    Lumbar Extension 50% limited    Lumbar - Right Side Bend 0% limited    Lumbar - Left Side Bend 0% limited    Lumbar - Right  Rotation 0% limited    Lumbar - Left Rotation 0% limited      Strength   Right Hip Flexion 5/5    Right Hip Extension 5/5    Right Hip ABduction 5/5    Left Hip Flexion 5/5    Left Hip Extension 5/5    Left Hip ABduction 5/5                                    PT Education - 11/28/21 1450     Education Details HEP, review of HEP, returning to PT if needed, progress made    Person(s) Educated Patient    Methods Explanation;Demonstration    Comprehension Verbalized understanding;Returned demonstration              PT Short Term Goals - 11/28/21 1456       PT SHORT TERM GOAL #1   Title Patient will be independent with HEP in order to improve functional outcomes.    Time 3    Period Weeks    Status Achieved    Target Date 11/08/21      PT SHORT TERM GOAL #2   Title Patient will report at least 25% improvement in symptoms for improved quality of life.    Time 3    Period Weeks    Status Achieved    Target Date 11/08/21               PT Long Term Goals - 11/28/21 1456       PT LONG TERM GOAL #1   Title Patient will report at least 75% improvement in symptoms for improved quality of life.    Time 6    Period Weeks    Status Achieved      PT LONG TERM GOAL #2   Title Patient will improve FOTO score by at least 5 points in order to indicate improved tolerance to activity.    Time 6    Period Weeks    Status Not Met      PT LONG TERM GOAL #3   Title Patient will demonstrate at least 25% improvement in lumbar ROM in all restricted planes for improved ability to move trunk while completing chores.  Time 6    Period Weeks    Status Achieved      PT LONG TERM GOAL #4   Title Patient with demonstrate 5/5 hip MMT in order to demonstrate improve strength for ADL.    Time 6    Period Weeks    Status Achieved                   Plan - 11/28/21 1450     Clinical Impression Statement Patient has met 2/2 short term goals  and  long term goals with ability to complete HEP and improvement in symptoms, strength, ROM, and functional mobility. Patient remains limited by c/o symptoms and intermittent difficulties with ADL. Reviewed HEP with patient and educated on returning to PT if needed.    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, cervical fusion    Examination-Activity Limitations Transfers;Locomotion Level;Carry;Lift;Stand;Stairs;Squat    Examination-Participation Restrictions Cleaning;Shop;Volunteer;Yard Work;Community Activity    Stability/Clinical Decision Making Stable/Uncomplicated    Rehab Potential Good    PT Frequency --    PT Duration --    PT Treatment/Interventions ADLs/Self Care Home Management;Cryotherapy;Aquatic Therapy;Electrical Stimulation;Iontophoresis 53m/ml Dexamethasone;Moist Heat;Traction;Ultrasound;DME Instruction;Gait training;Stair training;Functional mobility training;Therapeutic activities;Therapeutic exercise;Balance training;Neuromuscular re-education;Patient/family education;Orthotic Fit/Training;Manual techniques;Manual lymph drainage;Compression bandaging;Scar mobilization;Passive range of motion;Dry needling;Energy conservation;Splinting;Taping;Spinal Manipulations;Joint Manipulations    PT Next Visit Plan n/a    PT Home Exercise Plan [facility] BKR; 11/3 BKR, prone lying, LTR, heel slide 11/15 hip abd/ ext    Consulted and Agree with Plan of Care Patient             Patient will benefit from skilled therapeutic intervention in order to improve the following deficits and impairments:  Difficulty walking, Decreased activity tolerance, Increased muscle spasms, Pain, Impaired flexibility, Improper body mechanics, Decreased strength, Decreased mobility  Visit Diagnosis: Low back  pain, unspecified back pain laterality, unspecified chronicity, unspecified whether sciatica present  Muscle weakness (generalized)  Other abnormalities of gait and mobility  Other symptoms and signs involving the musculoskeletal system     Problem List Patient Active Problem List   Diagnosis Date Noted   History of colonic polyps 07/18/2018   Rectal bleeding 07/18/2018   Abdominal pain 07/18/2018    3:12 PM, 11/28/21 AMearl LatinPT, DPT [place] [address]Phone: [phone]  Fax:  [phone] Name: Darrell NeubertMRN: [id number] of Birth: 202/22/1952

## 2021-11-29 ENCOUNTER — Ambulatory Visit (HOSPITAL_COMMUNITY): Payer: No Typology Code available for payment source | Admitting: Physical Therapy

## 2021-12-01 ENCOUNTER — Encounter (HOSPITAL_COMMUNITY): Payer: No Typology Code available for payment source | Admitting: Physical Therapy

## 2022-03-20 ENCOUNTER — Emergency Department (HOSPITAL_COMMUNITY): Payer: No Typology Code available for payment source

## 2022-03-20 ENCOUNTER — Emergency Department (HOSPITAL_COMMUNITY)
Admission: EM | Admit: 2022-03-20 | Discharge: 2022-03-20 | Disposition: A | Discharge status: Home / Self Care | Payer: No Typology Code available for payment source | Attending: Emergency Medicine | Admitting: Emergency Medicine

## 2022-03-20 ENCOUNTER — Encounter (HOSPITAL_COMMUNITY): Payer: Self-pay

## 2022-03-20 ENCOUNTER — Other Ambulatory Visit: Payer: Self-pay

## 2022-03-20 DIAGNOSIS — Y92009 Unspecified place in unspecified non-institutional (private) residence as the place of occurrence of the external cause: Secondary | ICD-10-CM | POA: Diagnosis not present

## 2022-03-20 DIAGNOSIS — W19XXXA Unspecified fall, initial encounter: Secondary | ICD-10-CM | POA: Diagnosis not present

## 2022-03-20 DIAGNOSIS — R55 Syncope and collapse: Secondary | ICD-10-CM | POA: Diagnosis not present

## 2022-03-20 DIAGNOSIS — R102 Pelvic and perineal pain: Secondary | ICD-10-CM | POA: Diagnosis present

## 2022-03-20 DIAGNOSIS — M25552 Pain in left hip: Secondary | ICD-10-CM | POA: Insufficient documentation

## 2022-03-20 DIAGNOSIS — R1032 Left lower quadrant pain: Secondary | ICD-10-CM

## 2022-03-20 MED ORDER — OXYCODONE-ACETAMINOPHEN 5-325 MG PO TABS
1.0000 | ORAL_TABLET | Freq: Once | ORAL | Status: AC
  Administered 2022-03-20: 1 via ORAL
  Filled 2022-03-20: qty 1

## 2022-03-20 MED ORDER — IBUPROFEN 400 MG PO TABS
400.0000 mg | ORAL_TABLET | Freq: Once | ORAL | Status: AC
  Administered 2022-03-20: 400 mg via ORAL
  Filled 2022-03-20: qty 1

## 2022-03-20 NOTE — ED Triage Notes (Addendum)
Pt had a fall @ home on 2/27 after a syncopal episode after a colonoscopy. States he landed on his left hip. Pt states the pain has become unbearable and is now having to use a cane to get around.   ?

## 2022-03-20 NOTE — Discharge Instructions (Signed)
You were seen today for left hip and pelvis pain.  Your x-rays do not show any obvious fracture.  Trial 500 mg Tylenol every 6 hours for pain.  [facility]. ?

## 2022-03-20 NOTE — ED Provider Notes (Signed)
?[facility] ?Provider Note ? ? ?CSN: [id number] ?Arrival date & time: 03/20/22  0947 ? ?  ? ?History ? ?Chief Complaint  ?Patient presents with  ? Fall  ? ? ?Darrell Young is a 71 y.o. male. ? ?HPI ? ?  ? ?This is a 71 year old male who presents with left hip and pelvis pain.  Patient reports that he had a syncopal episode on 2/27.  He fell onto his left side.  He states that the syncopal episode was in the setting of having had a colonoscopy and sedation.  He got up quickly from bed and passed out.  He initially thought he was uninjured.  However he has had progressive left hip and pelvis pain.  He has been ambulatory but with significant pain specifically over the last several days.  He is now walking with a cane.  He is not taking anything for his pain.  States his pain is worse with range of motion.  Denies numbness or tingling of the foot.  Denies other injury. ? ?Home Medications ?Prior to Admission medications   ?Medication Sig Start Date End Date Taking? Authorizing Provider  ?acetaminophen (TYLENOL) 325 MG tablet Take 2 tablets (650 mg total) by mouth every 6 (six) hours as needed for up to 30 doses for mild pain or moderate pain. 06/12/20   Wyvonnia Dusky, MD  ?atorvastatin (LIPITOR) 80 MG tablet Take 80 mg by mouth at bedtime.    [provider]  ?benzonatate (TESSALON) 100 MG capsule Take 1 capsule (100 mg total) by mouth every 8 (eight) hours. 08/26/19   Wurst, [place], PA-C  ?budesonide-formoterol (SYMBICORT) 80-4.5 MCG/ACT inhaler Inhale 2 puffs into the lungs 2 (two) times daily.     [provider]  ?fluticasone (FLONASE) 50 MCG/ACT nasal spray Place 1 spray into both nostrils 2 (two) times daily.     [provider]  ?ibuprofen (ADVIL) 600 MG tablet Take 1 tablet (600 mg total) by mouth every 6 (six) hours as needed for up to 30 doses. 06/12/20   Wyvonnia Dusky, MD  ?methylPREDNISolone (MEDROL DOSEPAK) 4 MG TBPK tablet Use as directed on package  06/12/20   Wyvonnia Dusky, MD  ?omeprazole (PRILOSEC) 20 MG capsule Take 1 capsule (20 mg total) by mouth daily. 12/29/19   Recardo Evangelist, PA-C  ?OVER THE COUNTER MEDICATION Take 15 mLs by mouth 2 (two) times daily. [place] oil    [provider]  ?polyethylene glycol-electrolytes (TRILYTE) 420 g solution Take 4,000 mLs by mouth as directed. 07/18/18   Daneil Dolin, MD  ?Dellis Anes 160-4.5 MCG/ACT inhaler  03/26/19   [provider]  ?   ? ?Allergies    ?Aspirin   ? ?Review of Systems   ?Review of Systems  ?Musculoskeletal:   ?     Left hip pain  ?All other systems reviewed and are negative. ? ?Physical Exam ?Updated Vital Signs ?BP (!) 124/91   Pulse 74   Resp 18   Ht 1.88 m ('6\' 2"'$ )   Wt 104.3 kg   SpO2 98%   BMI 29.53 kg/m?  ?Physical Exam ?Vitals and nursing note reviewed.  ?Constitutional:   ?   Appearance: He is well-developed. He is not ill-appearing.  ?HENT:  ?   Head: Normocephalic and atraumatic.  ?   Mouth/Throat:  ?   Mouth: Mucous membranes are moist.  ?Eyes:  ?   Pupils: Pupils are equal, round, and reactive to light.  ?Cardiovascular:  ?  Rate and Rhythm: Normal rate and regular rhythm.  ?Pulmonary:  ?   Effort: Pulmonary effort is normal. No respiratory distress.  ?Abdominal:  ?   Palpations: Abdomen is soft.  ?   Tenderness: There is no abdominal tenderness. There is no rebound.  ?Musculoskeletal:  ?   Cervical back: Neck supple.  ?   Comments: Tenderness to palpation left inferior pelvic rim, no overlying skin changes, normal range of motion of the left hip but pain with range of motion, no tenderness over the greater trochanter, no obvious deformity, 2+ DP pulse distally  ?Lymphadenopathy:  ?   Cervical: No cervical adenopathy.  ?Skin: ?   General: Skin is warm and dry.  ?Neurological:  ?   Mental Status: He is alert and oriented to person, place, and time.  ? ? ?ED Results / Procedures / Treatments   ?Labs ?(all labs ordered are listed, but only abnormal results are  displayed) ?Labs Reviewed - No data to display ? ?EKG ?None ? ?Radiology ?DG Hip Unilat W or Wo Pelvis 2-3 Views Left ? ?Result Date: 03/20/2022 ?CLINICAL DATA:  Pain in the left groin area after a fall. EXAM: DG HIP (WITH OR WITHOUT PELVIS) 2-3V LEFT COMPARISON:  None. FINDINGS: No evidence for an acute fracture in the bony pelvis. SI joints and symphysis pubis unremarkable. Degenerative changes noted in both hips. AP and frog-leg lateral views of the left hip show no femoral neck fracture IMPRESSION: No acute findings. Electronically Signed   By: Misty Stanley M.D.   On: 03/20/2022 07:04   ? ?Procedures ?Procedures  ? ? ?Medications Ordered in ED ?Medications  ?oxyCODONE-acetaminophen (PERCOCET/ROXICET) 5-325 MG per tablet 1 tablet (1 tablet Oral Given 03/20/22 0626)  ?ibuprofen (ADVIL) tablet 400 mg (400 mg Oral Given 03/20/22 0710)  ? ? ?ED Course/ Medical Decision Making/ A&P ?  ?                        ?Medical Decision Making ?Amount and/or Complexity of Data Reviewed ?Radiology: ordered. ? ?Risk ?Prescription drug management. ? ? ?This patient presents to the ED for concern of left hip and pelvic pain, this involves an extensive number of treatment options, and is a complaint that carries with it a high risk of complications and morbidity.  The differential diagnosis includes bony injury such as fracture, ligamentous injury, contusion, strain ? ?MDM:   ? ?Patient presents with left hip and groin pain.  He is nontoxic and vital signs are reassuring.  He fell approximately 1 month ago and has had progressive pain.  He is now using a walker.  Pain is over the anterior aspect of the hip, lower pelvis at the inguinal ligament.  He is otherwise neurovascularly intact.  X-rays obtained.  Showed no evidence of hip fracture.  He could have an occult pelvic ring fracture or pubic rami fracture given location of pain.  Regardless, he would be weightbearing as tolerated.  He is not taking any pain medication.  Recommend  scheduled Tylenol.  We will have him follow-up closely with his primary doctor for PT. ?(Labs, imaging) ? ?Labs: ?I Ordered, and personally interpreted labs.  The pertinent results include: None ? ?Imaging Studies ordered: ?I ordered imaging studies including left hip x-rays ?I independently visualized and interpreted imaging. ?I agree with the radiologist interpretation ? ?Additional history obtained from wife.  External records from outside source obtained and reviewed including prior evaluations ? ?Critical Interventions: ?n/a ? ?Consultations: ?I  requested consultation with the n/a,  and discussed lab and imaging findings as well as pertinent plan - they recommend: n/a ? ?Cardiac Monitoring: ?The patient was maintained on a cardiac monitor.  I personally viewed and interpreted the cardiac monitored which showed an underlying rhythm of: NSR ? ?Reevaluation: ?After the interventions noted above, I reevaluated the patient and found that they have :improved ? ? ?Considered admission for:  n/a ? ?Social Determinants of Health: ?veteran ? ?Disposition:  d/c ? ?Co morbidities that complicate the patient evaluation ? ?Past Medical History:  ?Diagnosis Date  ? Asthma   ? Chronic cough   ? Chronic sinusitis   ? COPD (chronic obstructive pulmonary disease) ([facility])   ? Heart damage   ? 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 Hyperlipidemia   ? Lung disease   ?  ? ?Medicines ?Meds ordered this encounter  ?Medications  ? oxyCODONE-acetaminophen (PERCOCET/ROXICET) 5-325 MG per tablet 1 tablet  ? ibuprofen (ADVIL) tablet 400 mg  ?  ?I have reviewed the patients home medicines and have made adjustments as needed ? ?Problem List / ED Course: ?Problem List Items Addressed This Visit   ?None ?Visit Diagnoses   ? ? Left groin pain    -  Primary  ? ?  ?  ? ? ? ? ? ? ? ? ? ? ? ? ?Final Clinical Impression(s) / ED Diagnoses ?Final diagnoses:  ?Left groin pain  ? ? ?Rx / DC Orders ?ED Discharge Orders   ? ? None  ? ?   ? ? ?  ?Merryl Hacker, MD ?03/21/22 1749 ? ?

## 2022-09-28 ENCOUNTER — Telehealth: Payer: Self-pay | Admitting: *Deleted

## 2022-09-28 DIAGNOSIS — Z7189 Other specified counseling: Secondary | ICD-10-CM | POA: Insufficient documentation

## 2022-09-28 DIAGNOSIS — H04123 Dry eye syndrome of bilateral lacrimal glands: Secondary | ICD-10-CM | POA: Insufficient documentation

## 2022-09-28 DIAGNOSIS — K0252 Dental caries on pit and fissure surface penetrating into dentin: Secondary | ICD-10-CM | POA: Insufficient documentation

## 2022-09-28 DIAGNOSIS — M159 Polyosteoarthritis, unspecified: Secondary | ICD-10-CM | POA: Insufficient documentation

## 2022-09-28 DIAGNOSIS — M170 Bilateral primary osteoarthritis of knee: Secondary | ICD-10-CM | POA: Insufficient documentation

## 2022-09-28 DIAGNOSIS — Z7729 Contact with and (suspected ) exposure to other hazardous substances: Secondary | ICD-10-CM | POA: Insufficient documentation

## 2022-09-28 DIAGNOSIS — K219 Gastro-esophageal reflux disease without esophagitis: Secondary | ICD-10-CM | POA: Insufficient documentation

## 2022-09-28 DIAGNOSIS — M47816 Spondylosis without myelopathy or radiculopathy, lumbar region: Secondary | ICD-10-CM | POA: Insufficient documentation

## 2022-09-28 DIAGNOSIS — I714 Abdominal aortic aneurysm, without rupture, unspecified: Secondary | ICD-10-CM | POA: Insufficient documentation

## 2022-09-28 DIAGNOSIS — E119 Type 2 diabetes mellitus without complications: Secondary | ICD-10-CM | POA: Insufficient documentation

## 2022-09-28 DIAGNOSIS — J309 Allergic rhinitis, unspecified: Secondary | ICD-10-CM | POA: Insufficient documentation

## 2022-09-28 DIAGNOSIS — H524 Presbyopia: Secondary | ICD-10-CM | POA: Insufficient documentation

## 2022-09-28 DIAGNOSIS — D126 Benign neoplasm of colon, unspecified: Secondary | ICD-10-CM | POA: Insufficient documentation

## 2022-09-28 DIAGNOSIS — K760 Fatty (change of) liver, not elsewhere classified: Secondary | ICD-10-CM | POA: Insufficient documentation

## 2022-09-28 DIAGNOSIS — N50811 Right testicular pain: Secondary | ICD-10-CM | POA: Insufficient documentation

## 2022-09-28 DIAGNOSIS — G8929 Other chronic pain: Secondary | ICD-10-CM | POA: Insufficient documentation

## 2022-09-28 NOTE — Patient Outreach (Signed)
  Care Coordination   Initial Visit Note   09/28/2022 Name: Davied Nocito MRN: [id number] DOB: 06/03/1951  Ardith Lewman is a 71 y.o. year old male who sees [facility] for [facility]. I spoke with  Ardyth Harps by phone today.  What matters to the patients health and wellness today?  Please call me back tomorrow.    Goals Addressed   None     SDOH assessments and interventions completed:  Yes   Care Coordination Interventions Activated:  No   Care Coordination Interventions:  No, not indicated   Follow up plan: Follow up call scheduled for Friday Oct 6th.    Encounter Outcome:  Pt. Scheduled   Malka Bocek C. Myrtie Neither, MSN, [facility] Gerontological Nurse Practitioner [facility] Care Management [phone]

## 2022-09-29 ENCOUNTER — Ambulatory Visit: Payer: Self-pay | Admitting: *Deleted

## 2022-09-29 ENCOUNTER — Encounter: Payer: Self-pay | Admitting: *Deleted

## 2022-09-29 NOTE — Patient Outreach (Signed)
  Care Coordination   Initial Visit Note   09/29/2022 Name: Issam Carlyon MRN: [id number] DOB: 07-18-1951  Zaviyar Rahal is a 71 y.o. year old male who sees [facility] for [facility]. I spoke with  Ardyth Harps by phone today.  What matters to the patients health and wellness today?  Staying healthy    Goals Addressed   None     SDOH assessments and interventions completed:  Yes  SDOH Interventions Today    Flowsheet Row Most Recent Value  SDOH Interventions   Housing Interventions Intervention Not Indicated  Utilities Interventions Intervention Not Indicated  Alcohol Usage Interventions Intervention Not Indicated (Score <7)  Financial Strain Interventions Intervention Not Indicated  Physical Activity Interventions Other (Comments)  [Very active with [employer] for funeral servcies for VETS and is Cathleen Corti at this church.]  Stress Interventions Intervention Not Indicated        Care Coordination Interventions Activated:  No  Care Coordination Interventions:  No, not indicated   Follow up plan: No further intervention required.   Encounter Outcome:  Pt. Visit Completed   Kayleen Memos C. Myrtie Neither, MSN, [facility] Gerontological Nurse Practitioner [facility] Care Management [phone]

## 2023-08-06 ENCOUNTER — Encounter: Payer: Self-pay | Admitting: *Deleted

## 2023-12-14 ENCOUNTER — Emergency Department (HOSPITAL_COMMUNITY)
Admission: EM | Admit: 2023-12-14 | Discharge: 2023-12-14 | Disposition: A | Discharge status: Home / Self Care | Payer: No Typology Code available for payment source | Attending: Emergency Medicine | Admitting: Emergency Medicine

## 2023-12-14 ENCOUNTER — Encounter (HOSPITAL_COMMUNITY): Payer: Self-pay | Admitting: Emergency Medicine

## 2023-12-14 ENCOUNTER — Other Ambulatory Visit: Payer: Self-pay

## 2023-12-14 DIAGNOSIS — R319 Hematuria, unspecified: Secondary | ICD-10-CM | POA: Diagnosis present

## 2023-12-14 DIAGNOSIS — Z7984 Long term (current) use of oral hypoglycemic drugs: Secondary | ICD-10-CM | POA: Diagnosis not present

## 2023-12-14 DIAGNOSIS — B9689 Other specified bacterial agents as the cause of diseases classified elsewhere: Secondary | ICD-10-CM | POA: Insufficient documentation

## 2023-12-14 DIAGNOSIS — E119 Type 2 diabetes mellitus without complications: Secondary | ICD-10-CM | POA: Diagnosis not present

## 2023-12-14 DIAGNOSIS — N390 Urinary tract infection, site not specified: Secondary | ICD-10-CM | POA: Diagnosis not present

## 2023-12-14 LAB — COMPREHENSIVE METABOLIC PANEL
ALT: 16 U/L (ref 0–44)
AST: 16 U/L (ref 15–41)
Albumin: 4.2 g/dL (ref 3.5–5.0)
Alkaline Phosphatase: 58 U/L (ref 38–126)
Anion gap: 9 (ref 5–15)
BUN: 18 mg/dL (ref 8–23)
CO2: 28 mmol/L (ref 22–32)
Calcium: 9.4 mg/dL (ref 8.9–10.3)
Chloride: 104 mmol/L (ref 98–111)
Creatinine, Ser: 0.96 mg/dL (ref 0.61–1.24)
GFR, Estimated: >60 mL/min (ref >60)
Glucose, Bld: 87 mg/dL (ref 70–99)
Potassium: 3.8 mmol/L (ref 3.5–5.1)
Sodium: 141 mmol/L (ref 135–145)
Total Bilirubin: 1.7 mg/dL — ABNORMAL HIGH (ref <1.2)
Total Protein: 7.4 g/dL (ref 6.5–8.1)

## 2023-12-14 LAB — CBC WITH DIFFERENTIAL/PLATELET
Abs Immature Granulocytes: 0 10*3/uL (ref 0.00–0.07)
Basophils Absolute: 0.1 10*3/uL (ref 0.0–0.1)
Basophils Relative: 1 %
Eosinophils Absolute: 0 10*3/uL (ref 0.0–0.5)
Eosinophils Relative: 0 %
HCT: 38.4 % — ABNORMAL LOW (ref 39.0–52.0)
Hemoglobin: 12 g/dL — ABNORMAL LOW (ref 13.0–17.0)
Lymphocytes Relative: 41 %
Lymphs Abs: 3.4 10*3/uL (ref 0.7–4.0)
MCH: 24.1 pg — ABNORMAL LOW (ref 26.0–34.0)
MCHC: 31.3 g/dL (ref 30.0–36.0)
MCV: 77.1 fL — ABNORMAL LOW (ref 80.0–100.0)
Monocytes Absolute: 0.3 10*3/uL (ref 0.1–1.0)
Monocytes Relative: 4 %
Neutro Abs: 4.5 10*3/uL (ref 1.7–7.7)
Neutrophils Relative %: 54 %
Platelets: 138 10*3/uL — ABNORMAL LOW (ref 150–400)
RBC: 4.98 MIL/uL (ref 4.22–5.81)
RDW: 14.8 % (ref 11.5–15.5)
WBC: 8.4 10*3/uL (ref 4.0–10.5)
nRBC: 0 % (ref 0.0–0.2)

## 2023-12-14 LAB — URINALYSIS, ROUTINE W REFLEX MICROSCOPIC
Bilirubin Urine: NEGATIVE
Glucose, UA: NEGATIVE mg/dL
Ketones, ur: NEGATIVE mg/dL
Nitrite: NEGATIVE
Protein, ur: 30 mg/dL — AB
RBC / HPF: >50 RBC/hpf (ref 0–5)
Specific Gravity, Urine: 1.021 (ref 1.005–1.030)
pH: 5 (ref 5.0–8.0)

## 2023-12-14 LAB — SAMPLE TO BLOOD BANK

## 2023-12-14 LAB — APTT: aPTT: 23 s — ABNORMAL LOW (ref 24–36)

## 2023-12-14 LAB — PROTIME-INR
INR: 1.1 (ref 0.8–1.2)
Prothrombin Time: 14.2 s (ref 11.4–15.2)

## 2023-12-14 MED ORDER — SULFAMETHOXAZOLE-TRIMETHOPRIM 800-160 MG PO TABS
1.0000 | ORAL_TABLET | Freq: Once | ORAL | Status: AC
  Administered 2023-12-14: 1 via ORAL
  Filled 2023-12-14: qty 1

## 2023-12-14 MED ORDER — SULFAMETHOXAZOLE-TRIMETHOPRIM 800-160 MG PO TABS: 1.0000 | ORAL_TABLET | Freq: Two times a day (BID) | ORAL | 0 refills | Status: AC

## 2023-12-14 NOTE — ED Provider Triage Note (Signed)
Emergency Medicine Provider Triage Evaluation Note  Darrell Young , a 72 y.o. male  was evaluated in triage.  Pt complains of He has PMH of T2DM, fatty liver.  Presents to the ER for penile bleeding that started last night, it is when he urinates but also is constantly flowing out to the point where he has to wear a pad in his pants, it saturated through the pad overnight.  He states when he urinated here in the ER provide a sample, he filled the cup with blood again.  Denies dizziness or syncope, is not on blood thinners, denies inserting anything into his urethra.  He is able to empty his bladder, no abdominal pain nausea or vomiting.  No history of bleeding disorders.  Denies any other abnormal bleeding, denies easy bruising..  Review of Systems  Positive: Bleeding from penis Negative: Fever, pain  Physical Exam  BP 121/70   Pulse 65   Temp 98 F (36.7 C)   Resp 18   Ht 6\' 2"  (1.88 m)   Wt 93 kg   SpO2 100%   BMI 26.32 kg/m  Gen:   Awake, no distress   Resp:  Normal effort  MSK:   Moves extremities without difficulty  Other:    Medical Decision Making  Medically screening exam initiated at 12:16 PM.  Appropriate orders placed.  Darrell Young was informed that the remainder of the evaluation will be completed by another provider, this initial triage assessment does not replace that evaluation, and the importance of remaining in the ED until their evaluation is complete.     Ma Rings, [place] 12/14/23 1217

## 2023-12-14 NOTE — ED Provider Notes (Incomplete)
[facility] EMERGENCY DEPARTMENT AT [facility] Provider Note   CSN: [id number] Arrival date & time: 12/14/23  0932     History {Add pertinent medical, surgical, social history, OB history to HPI:1} Chief Complaint  Patient presents with   Hematuria    Darrell Young is a 72 y.o. male.  He has PMH of T2DM, fatty liver.  Presents to the ER for penile bleeding that started last night, it is when he urinates but also is constantly flowing out to the point where he has to wear a pad in his pants, it saturated through the pad overnight.  He states when he urinated here in the ER provide a sample, he filled the cup with blood again.  Denies dizziness or syncope, is not on blood thinners, denies inserting anything into his urethra.  He is able to empty his bladder, no abdominal pain nausea or vomiting.  No history of bleeding disorders.  Denies any other abnormal bleeding, denies easy bruising.   Hematuria       Home Medications Prior to Admission medications   Medication Sig Start Date End Date Taking? Authorizing Provider  acetaminophen (TYLENOL) 325 MG tablet Take 2 tablets (650 mg total) by mouth every 6 (six) hours as needed for up to 30 doses for mild pain or moderate pain. 06/12/20   Terald Sleeper, MD  Ascorbic Acid (VITAMIN C) 1000 MG tablet Take 1,000 mg by mouth daily.    [provider]  atorvastatin (LIPITOR) 80 MG tablet Take 80 mg by mouth at bedtime.    [provider]  azelastine (ASTELIN) 0.1 % nasal spray Place 1 spray into both nostrils 2 (two) times daily. Use in each nostril as directed    [provider]  benzonatate (TESSALON) 100 MG capsule Take 1 capsule (100 mg total) by mouth every 8 (eight) hours. 08/26/19   Wurst, [place], PA-C  fluticasone (FLONASE) 50 MCG/ACT nasal spray Place 1 spray into both nostrils 2 (two) times daily.     [provider]  Fluticasone-Umeclidin-Vilant (TRELEGY ELLIPTA) 100-62.5-25 MCG/ACT  AEPB Inhale 1 puff into the lungs daily.    [provider]  metFORMIN (GLUCOPHAGE) 500 MG tablet Take 500 mg by mouth 2 (two) times daily with a meal.    [provider]  montelukast (SINGULAIR) 10 MG tablet Take 10 mg by mouth at bedtime.    [provider]  Multiple Vitamin (MULTIVITAMIN WITH MINERALS) TABS tablet Take 1 tablet by mouth daily.    [provider]      Allergies    Aspirin    Review of Systems   Review of Systems  Genitourinary:  Positive for hematuria.    Physical Exam Updated Vital Signs BP 121/70   Pulse 65   Temp 98 F (36.7 C)   Resp 18   Ht 6\' 2"  (1.88 m)   Wt 93 kg   SpO2 100%   BMI 26.32 kg/m  Physical Exam  ED Results / Procedures / Treatments   Labs (all labs ordered are listed, but only abnormal results are displayed) Labs Reviewed  URINALYSIS, ROUTINE W REFLEX MICROSCOPIC    EKG None  Radiology No results found.  Procedures Procedures  {Document cardiac monitor, telemetry assessment procedure when appropriate:1}  Medications Ordered in ED Medications - No data to display  ED Course/ Medical Decision Making/ A&P   {   Click here for ABCD2, HEART and other calculatorsREFRESH Note before signing :1}  Medical Decision Making  ***  {Document critical care time when appropriate:1} {Document review of labs and clinical decision tools ie heart score, Chads2Vasc2 etc:1}  {Document your independent review of radiology images, and any outside records:1} {Document your discussion with family members, caretakers, and with consultants:1} {Document social determinants of health affecting pt's care:1} {Document your decision making why or why not admission, treatments were needed:1} Final Clinical Impression(s) / ED Diagnoses Final diagnoses:  None    Rx / DC Orders ED Discharge Orders     None

## 2023-12-14 NOTE — Discharge Instructions (Signed)
You were seen in the emerged part for blood coming from your penis and blood in your urine The results show blood in your urine and may be a urinary tract infection but we need to send this for culture to confirm if there is actually an infection We have started you on antibiotics and called the rest into your pharmacy to take for presumed urinary tract infection There is no need for more testing or admission today but it is important that you follow-up with urology in the office for more testing to find the cause of this bleeding Please call the number for the office listed for Dr. Ronne Binning or see your regular urologist for follow-up in the office is soon as able to get in for an appointment Return to the emergency department for severe bleeding, pain or any other concerns

## 2023-12-14 NOTE — ED Provider Notes (Signed)
[facility] EMERGENCY DEPARTMENT AT [facility] Provider Note   CSN: [id number] Arrival date & time: 12/14/23  0932     History  Chief Complaint  Patient presents with   Hematuria   Darrell Young is a 72 y.o. male.  He has PMH of T2DM, fatty liver.  Presents to the ER for penile bleeding that started last night, it is when he urinates but also is constantly flowing out to the point where he has to wear a pad in his pants, it saturated through the pad overnight.  He states when he urinated here in the ER provide a sample, he filled the cup with blood again.  Denies dizziness or syncope, is not on blood thinners, denies inserting anything into his urethra.  He is able to empty his bladder, no abdominal pain nausea or vomiting.  No history of bleeding disorders.  Denies any other abnormal bleeding, denies easy bruising.    Hematuria       Home Medications Prior to Admission medications   Medication Sig Start Date End Date Taking? Authorizing Provider  sulfamethoxazole-trimethoprim (BACTRIM DS) 800-160 MG tablet Take 1 tablet by mouth 2 (two) times daily for 7 days. 12/14/23 12/21/23 Yes Royanne Foots, DO  acetaminophen (TYLENOL) 325 MG tablet Take 2 tablets (650 mg total) by mouth every 6 (six) hours as needed for up to 30 doses for mild pain or moderate pain. 06/12/20   Terald Sleeper, MD  Ascorbic Acid (VITAMIN C) 1000 MG tablet Take 1,000 mg by mouth daily.    [provider]  atorvastatin (LIPITOR) 80 MG tablet Take 80 mg by mouth at bedtime.    [provider]  azelastine (ASTELIN) 0.1 % nasal spray Place 1 spray into both nostrils 2 (two) times daily. Use in each nostril as directed    [provider]  benzonatate (TESSALON) 100 MG capsule Take 1 capsule (100 mg total) by mouth every 8 (eight) hours. 08/26/19   Wurst, [place], PA-C  fluticasone (FLONASE) 50 MCG/ACT nasal spray Place 1 spray into both nostrils 2 (two) times daily.      [provider]  Fluticasone-Umeclidin-Vilant (TRELEGY ELLIPTA) 100-62.5-25 MCG/ACT AEPB Inhale 1 puff into the lungs daily.    [provider]  metFORMIN (GLUCOPHAGE) 500 MG tablet Take 500 mg by mouth 2 (two) times daily with a meal.    [provider]  montelukast (SINGULAIR) 10 MG tablet Take 10 mg by mouth at bedtime.    [provider]  Multiple Vitamin (MULTIVITAMIN WITH MINERALS) TABS tablet Take 1 tablet by mouth daily.    [provider]      Allergies    Aspirin    Review of Systems   Review of Systems  Genitourinary:  Positive for hematuria.    Physical Exam Updated Vital Signs BP 121/70   Pulse 65   Temp 98 F (36.7 C)   Resp 18   Ht 6\' 2"  (1.88 m)   Wt 93 kg   SpO2 100%   BMI 26.32 kg/m  Physical Exam Vitals and nursing note reviewed.  HENT:     Head: Normocephalic and atraumatic.  Eyes:     Pupils: Pupils are equal, round, and reactive to light.  Cardiovascular:     Rate and Rhythm: Normal rate and regular rhythm.  Pulmonary:     Effort: Pulmonary effort is normal.     Breath sounds: Normal breath sounds.  Abdominal:     Palpations: Abdomen is  soft.     Tenderness: There is no abdominal tenderness.  Genitourinary:    Testes: Normal.     Comments: Dried blood at urethral meatus Skin:    General: Skin is warm and dry.  Neurological:     Mental Status: He is alert.  Psychiatric:        Mood and Affect: Mood normal.     ED Results / Procedures / Treatments   Labs (all labs ordered are listed, but only abnormal results are displayed) Labs Reviewed  URINALYSIS, ROUTINE W REFLEX MICROSCOPIC - Abnormal; Notable for the following components:      Result Value   APPearance HAZY (*)    Hgb urine dipstick LARGE (*)    Protein, ur 30 (*)    Leukocytes,Ua MODERATE (*)    Bacteria, UA RARE (*)    All other components within normal limits  CBC WITH DIFFERENTIAL/PLATELET - Abnormal; Notable for the following  components:   Hemoglobin 12.0 (*)    HCT 38.4 (*)    MCV 77.1 (*)    MCH 24.1 (*)    Platelets 138 (*)    All other components within normal limits  APTT - Abnormal; Notable for the following components:   aPTT 23 (*)    All other components within normal limits  COMPREHENSIVE METABOLIC PANEL - Abnormal; Notable for the following components:   Total Bilirubin 1.7 (*)    All other components within normal limits  PROTIME-INR  SAMPLE TO BLOOD BANK    EKG None  Radiology No results found.  Procedures Procedures    Medications Ordered in ED Medications  sulfamethoxazole-trimethoprim (BACTRIM DS) 800-160 MG per tablet 1 tablet (1 tablet Oral Given 12/14/23 1601)    ED Course/ Medical Decision Making/ A&P                                 Medical Decision Making 72 year old male with history as above presenting for painless hematuria x 2 days.  Has been soaking through multiple pads.  Persistent when not urinating.  Bright red blood while urinating.  Has a history of kidney stones and painless hematuria in the past.  Sees urology for BPH.  No CVA tenderness or abdominal discomfort.  No recent illness.  Exam notable for dried blood at the urethral meatus with no external lesions.  Labs show hematuria with question of UTI which we will send for culture.  No other significant abnormalities.  Discussed with urology (Dr. Ronne Binning) who recommends starting Bactrim for treatment of potential UTI, sending urine for culture and outpatient urology follow-up.  No need for CBI given the patient is urinating on his own.  Plan for imaging as outpatient in the office.  Risk Prescription drug management.           Final Clinical Impression(s) / ED Diagnoses Final diagnoses:  Hematuria, unspecified type  Lower urinary tract infectious disease    Rx / DC Orders ED Discharge Orders          Ordered    sulfamethoxazole-trimethoprim (BACTRIM DS) 800-160 MG tablet  2 times daily         12/14/23 1620              Royanne Foots, DO 12/14/23 1622

## 2023-12-14 NOTE — ED Triage Notes (Signed)
Pt reports bleeding from penis since last night. States it is constant, not only when urinating. Denies pain.

## 2024-01-05 ENCOUNTER — Emergency Department (HOSPITAL_COMMUNITY): Payer: No Typology Code available for payment source

## 2024-01-05 ENCOUNTER — Encounter (HOSPITAL_COMMUNITY): Payer: Self-pay | Admitting: *Deleted

## 2024-01-05 ENCOUNTER — Other Ambulatory Visit: Payer: Self-pay

## 2024-01-05 ENCOUNTER — Emergency Department (HOSPITAL_COMMUNITY)
Admission: EM | Admit: 2024-01-05 | Discharge: 2024-01-05 | Disposition: A | Discharge status: Home / Self Care | Payer: No Typology Code available for payment source

## 2024-01-05 DIAGNOSIS — R319 Hematuria, unspecified: Secondary | ICD-10-CM | POA: Insufficient documentation

## 2024-01-05 DIAGNOSIS — R55 Syncope and collapse: Secondary | ICD-10-CM | POA: Diagnosis not present

## 2024-01-05 DIAGNOSIS — I672 Cerebral atherosclerosis: Secondary | ICD-10-CM | POA: Diagnosis not present

## 2024-01-05 DIAGNOSIS — Z20822 Contact with and (suspected) exposure to covid-19: Secondary | ICD-10-CM | POA: Insufficient documentation

## 2024-01-05 DIAGNOSIS — I6782 Cerebral ischemia: Secondary | ICD-10-CM | POA: Insufficient documentation

## 2024-01-05 DIAGNOSIS — R519 Headache, unspecified: Secondary | ICD-10-CM | POA: Diagnosis not present

## 2024-01-05 DIAGNOSIS — R42 Dizziness and giddiness: Secondary | ICD-10-CM | POA: Diagnosis not present

## 2024-01-05 DIAGNOSIS — R5381 Other malaise: Secondary | ICD-10-CM | POA: Insufficient documentation

## 2024-01-05 LAB — URINALYSIS, ROUTINE W REFLEX MICROSCOPIC
Bilirubin Urine: NEGATIVE
Glucose, UA: NEGATIVE mg/dL
Ketones, ur: NEGATIVE mg/dL
Nitrite: NEGATIVE
Protein, ur: 30 mg/dL — AB
RBC / HPF: >50 RBC/hpf (ref 0–5)
Specific Gravity, Urine: 1.015 (ref 1.005–1.030)
WBC, UA: >50 WBC/hpf (ref 0–5)
pH: 6 (ref 5.0–8.0)

## 2024-01-05 LAB — RESP PANEL BY RT-PCR (RSV, FLU A&B, COVID)  RVPGX2
Influenza A by PCR: NEGATIVE
Influenza B by PCR: NEGATIVE
Resp Syncytial Virus by PCR: NEGATIVE
SARS Coronavirus 2 by RT PCR: NEGATIVE

## 2024-01-05 LAB — COMPREHENSIVE METABOLIC PANEL
ALT: 16 U/L (ref 0–44)
AST: 29 U/L (ref 15–41)
Albumin: 3.6 g/dL (ref 3.5–5.0)
Alkaline Phosphatase: 60 U/L (ref 38–126)
Anion gap: 10 (ref 5–15)
BUN: 12 mg/dL (ref 8–23)
CO2: 26 mmol/L (ref 22–32)
Calcium: 9.3 mg/dL (ref 8.9–10.3)
Chloride: 102 mmol/L (ref 98–111)
Creatinine, Ser: 1.16 mg/dL (ref 0.61–1.24)
GFR, Estimated: >60 mL/min (ref >60)
Glucose, Bld: 123 mg/dL — ABNORMAL HIGH (ref 70–99)
Potassium: 4.4 mmol/L (ref 3.5–5.1)
Sodium: 138 mmol/L (ref 135–145)
Total Bilirubin: 3 mg/dL — ABNORMAL HIGH (ref 0.0–1.2)
Total Protein: 7.1 g/dL (ref 6.5–8.1)

## 2024-01-05 LAB — CBC WITH DIFFERENTIAL/PLATELET
Abs Immature Granulocytes: 0.03 10*3/uL (ref 0.00–0.07)
Basophils Absolute: 0 10*3/uL (ref 0.0–0.1)
Basophils Relative: 0 %
Eosinophils Absolute: 0.1 10*3/uL (ref 0.0–0.5)
Eosinophils Relative: 1 %
HCT: 38.6 % — ABNORMAL LOW (ref 39.0–52.0)
Hemoglobin: 12.3 g/dL — ABNORMAL LOW (ref 13.0–17.0)
Immature Granulocytes: 0 %
Lymphocytes Relative: 16 %
Lymphs Abs: 1.7 10*3/uL (ref 0.7–4.0)
MCH: 24.2 pg — ABNORMAL LOW (ref 26.0–34.0)
MCHC: 31.9 g/dL (ref 30.0–36.0)
MCV: 76 fL — ABNORMAL LOW (ref 80.0–100.0)
Monocytes Absolute: 0.9 10*3/uL (ref 0.1–1.0)
Monocytes Relative: 9 %
Neutro Abs: 7.8 10*3/uL — ABNORMAL HIGH (ref 1.7–7.7)
Neutrophils Relative %: 74 %
Platelets: 131 10*3/uL — ABNORMAL LOW (ref 150–400)
RBC: 5.08 MIL/uL (ref 4.22–5.81)
RDW: 13.4 % (ref 11.5–15.5)
WBC: 10.6 10*3/uL — ABNORMAL HIGH (ref 4.0–10.5)
nRBC: 0 % (ref 0.0–0.2)

## 2024-01-05 LAB — LIPASE, BLOOD: Lipase: 24 U/L (ref 11–51)

## 2024-01-05 MED ORDER — SCOPOLAMINE 1 MG/3DAYS TD PT72: 1.0000 | MEDICATED_PATCH | TRANSDERMAL | 0 refills | Status: DC

## 2024-01-05 MED ORDER — MECLIZINE HCL 25 MG PO TABS: 25.0000 mg | ORAL_TABLET | Freq: Three times a day (TID) | ORAL | 0 refills | Status: AC | PRN

## 2024-01-05 MED ORDER — SCOPOLAMINE 1 MG/3DAYS TD PT72: 1.0000 | MEDICATED_PATCH | TRANSDERMAL | 0 refills | Status: AC

## 2024-01-05 MED ORDER — CEPHALEXIN 500 MG PO CAPS: 500.0000 mg | ORAL_CAPSULE | Freq: Two times a day (BID) | ORAL | 0 refills | Status: DC

## 2024-01-05 MED ORDER — ONDANSETRON HCL 4 MG PO TABS: 4.0000 mg | ORAL_TABLET | Freq: Four times a day (QID) | ORAL | 0 refills | Status: AC

## 2024-01-05 MED ORDER — ONDANSETRON HCL 4 MG PO TABS: 4.0000 mg | ORAL_TABLET | Freq: Four times a day (QID) | ORAL | 0 refills | Status: DC

## 2024-01-05 MED ORDER — MECLIZINE HCL 25 MG PO TABS
25.0000 mg | ORAL_TABLET | Freq: Once | ORAL | Status: AC
  Administered 2024-01-05: 25 mg via ORAL
  Filled 2024-01-05: qty 1

## 2024-01-05 MED ORDER — SODIUM CHLORIDE 0.9 % IV BOLUS
1000.0000 mL | Freq: Once | INTRAVENOUS | Status: AC
  Administered 2024-01-05: 1000 mL via INTRAVENOUS

## 2024-01-05 MED ORDER — MECLIZINE HCL 25 MG PO TABS: 25.0000 mg | ORAL_TABLET | Freq: Three times a day (TID) | ORAL | 0 refills | Status: DC | PRN

## 2024-01-05 MED ORDER — CEPHALEXIN 500 MG PO CAPS: 500.0000 mg | ORAL_CAPSULE | Freq: Two times a day (BID) | ORAL | 0 refills | Status: AC

## 2024-01-05 NOTE — ED Notes (Signed)
 PT was able to ambulate without difficulty around room with some dizziness

## 2024-01-05 NOTE — ED Triage Notes (Addendum)
 C/o hematuria onset 2 days ago states he had the same problem in Dec and bled for 2 weeks c/o dizziness and headache and states his stomach is messed up. Denies pain , states he has been seen at the [facility]. States he had a CT scan and was told he had a very large prostate

## 2024-01-05 NOTE — Discharge Instructions (Signed)
 Please follow-up with your primary doctor.  Return immediately if develop fevers, chills, worsening headache, facial droop, unilateral weakness, persistent and constant vertigo, inability to walk, inability eat or drink due to nausea vomiting or he develop any new or worsening symptoms that are concerning to you.

## 2024-01-05 NOTE — ED Provider Notes (Addendum)
  EMERGENCY DEPARTMENT AT [facility] Provider Note   CSN: [id number] Arrival date & time: 01/05/24  1142     History  Chief Complaint  Patient presents with   Hematuria    Darrell Young is a 73 y.o. male.  This is a 73 year old male presenting emergency department with multiple complaints.  Complains of generalized malaise for the past 2 days.  Has had a headache since the 21st.  Typically does not have headaches.  They seemingly do subside but still somewhat there.  Noted the top of the head.  No vision changes, trauma.  No unilateral weakness, facial droop.  Patient has had hematuria and has been following with urology.  Saw them this past Thursday reported negative cystoscopy.  Complains of lightheadedness.  Worsened with head movement and ambulation.  Last for several seconds.   Hematuria       Home Medications Prior to Admission medications   Medication Sig Start Date End Date Taking? Authorizing Provider  acetaminophen  (TYLENOL ) 325 MG tablet Take 2 tablets (650 mg total) by mouth every 6 (six) hours as needed for up to 30 doses for mild pain or moderate pain. 06/12/20   Cottie Donnice PARAS, MD  Ascorbic Acid (VITAMIN C) 1000 MG tablet Take 1,000 mg by mouth daily.    [provider]  atorvastatin  (LIPITOR) 80 MG tablet Take 80 mg by mouth at bedtime.    [provider]  azelastine (ASTELIN) 0.1 % nasal spray Place 1 spray into both nostrils 2 (two) times daily. Use in each nostril as directed    [provider]  benzonatate  (TESSALON ) 100 MG capsule Take 1 capsule (100 mg total) by mouth every 8 (eight) hours. 08/26/19   Wurst, Brittany, PA-C  cephALEXin  (KEFLEX ) 500 MG capsule Take 1 capsule (500 mg total) by mouth 2 (two) times daily for 5 days. 01/05/24 01/10/24  Neysa Caron PARAS, DO  fluticasone (FLONASE) 50 MCG/ACT nasal spray Place 1 spray into both nostrils 2 (two) times daily.     [provider]   Fluticasone-Umeclidin-Vilant (TRELEGY ELLIPTA) 100-62.5-25 MCG/ACT AEPB Inhale 1 puff into the lungs daily.    [provider]  meclizine  (ANTIVERT ) 25 MG tablet Take 1 tablet (25 mg total) by mouth 3 (three) times daily as needed for dizziness. 01/05/24   Neysa Caron PARAS, DO  metFORMIN (GLUCOPHAGE) 500 MG tablet Take 500 mg by mouth 2 (two) times daily with a meal.    [provider]  montelukast (SINGULAIR) 10 MG tablet Take 10 mg by mouth at bedtime.    [provider]  Multiple Vitamin (MULTIVITAMIN WITH MINERALS) TABS tablet Take 1 tablet by mouth daily.    [provider]  ondansetron  (ZOFRAN ) 4 MG tablet Take 1 tablet (4 mg total) by mouth every 6 (six) hours. 01/05/24   Neysa Caron PARAS, DO  scopolamine  (TRANSDERM-SCOP) 1 MG/3DAYS Place 1 patch (1.5 mg total) onto the skin every 3 (three) days for 9 days. 01/05/24 01/14/24  Neysa Caron PARAS, DO      Allergies    Aspirin    Review of Systems   Review of Systems  Genitourinary:  Positive for hematuria.    Physical Exam Updated Vital Signs BP 111/78 (BP Location: Right Arm)   Pulse 76   Temp 98.3 F (36.8 C) (Oral)   Resp 14   Ht 6' 3 (1.905 m)   Wt 96.6 kg   SpO2 100%   BMI 26.62 kg/m  Physical Exam  Vitals and nursing note reviewed.  Constitutional:      General: He is not in acute distress.    Appearance: He is not toxic-appearing.  HENT:     Head: Normocephalic.     Mouth/Throat:     Mouth: Mucous membranes are moist.  Eyes:     Conjunctiva/sclera: Conjunctivae normal.  Cardiovascular:     Rate and Rhythm: Normal rate and regular rhythm.  Pulmonary:     Effort: Pulmonary effort is normal.     Breath sounds: Normal breath sounds.  Abdominal:     General: Abdomen is flat. There is no distension.     Tenderness: There is no abdominal tenderness. There is no guarding or rebound.  Musculoskeletal:        General: Normal range of motion.  Skin:    General: Skin is warm.      Capillary Refill: Capillary refill takes less than 2 seconds.  Neurological:     General: No focal deficit present.     Mental Status: He is alert.  Psychiatric:        Mood and Affect: Mood normal.        Behavior: Behavior normal.     ED Results / Procedures / Treatments   Labs (all labs ordered are listed, but only abnormal results are displayed) Labs Reviewed  URINALYSIS, ROUTINE W REFLEX MICROSCOPIC - Abnormal; Notable for the following components:      Result Value   APPearance HAZY (*)    Hgb urine dipstick LARGE (*)    Protein, ur 30 (*)    Leukocytes,Ua LARGE (*)    Bacteria, UA RARE (*)    All other components within normal limits  CBC WITH DIFFERENTIAL/PLATELET - Abnormal; Notable for the following components:   WBC 10.6 (*)    Hemoglobin 12.3 (*)    HCT 38.6 (*)    MCV 76.0 (*)    MCH 24.2 (*)    Platelets 131 (*)    Neutro Abs 7.8 (*)    All other components within normal limits  COMPREHENSIVE METABOLIC PANEL - Abnormal; Notable for the following components:   Glucose, Bld 123 (*)    Total Bilirubin 3.0 (*)    All other components within normal limits  RESP PANEL BY RT-PCR (RSV, FLU A&B, COVID)  RVPGX2  LIPASE, BLOOD    EKG EKG Interpretation Date/Time:  Saturday January 05 2024 13:19:15 EST Ventricular Rate:  80 PR Interval:  196 QRS Duration:  86 QT Interval:  381 QTC Calculation: 440 R Axis:   -8  Text Interpretation: Sinus rhythm Ventricular premature complex Low voltage, extremity leads Confirmed by Neysa Clap [phone]) on 01/05/2024 2:21:52 PM  Radiology CT Head Wo Contrast Result Date: 01/05/2024 CLINICAL DATA:  Headache, new onset (Age >= 51y) EXAM: CT HEAD WITHOUT CONTRAST TECHNIQUE: Contiguous axial images were obtained from the base of the skull through the vertex without intravenous contrast. RADIATION DOSE REDUCTION: This exam was performed according to the departmental dose-optimization program which includes automated exposure control,  adjustment of the mA and/or kV according to patient size and/or use of iterative reconstruction technique. COMPARISON:  CT head May 11, 2015. FINDINGS: Mildly motion limited study.  Within this limitation: Brain: No evidence of acute infarction, hemorrhage, hydrocephalus, extra-axial collection or mass lesion/mass effect. Chronic dural calcifications. Patchy white matter hypodensities are nonspecific but compatible with chronic microvascular ischemic disease. Vascular: Calcific atherosclerosis. No hyperdense vessel identified. Skull: No acute fracture. Sinuses/Orbits: Mostly clear sinuses.  No acute orbital  findings. Other: No mastoid effusions. IMPRESSION: No evidence of acute intracranial abnormality. Electronically Signed   By: Gilmore GORMAN Molt M.D.   On: 01/05/2024 13:02   DG Chest Portable 1 View Result Date: 01/05/2024 CLINICAL DATA:  Near syncope. EXAM: PORTABLE CHEST 1 VIEW COMPARISON:  08/26/2019 FINDINGS: The lungs are clear without focal pneumonia, edema, pneumothorax or pleural effusion. The cardiopericardial silhouette is within normal limits for size. No acute bony abnormality. IMPRESSION: No active disease. Electronically Signed   By: Camellia Candle M.D.   On: 01/05/2024 12:44    Procedures Procedures    Medications Ordered in ED Medications  meclizine  (ANTIVERT ) tablet 25 mg (25 mg Oral Given 01/05/24 1332)  sodium chloride  0.9 % bolus 1,000 mL (0 mLs Intravenous Stopped 01/05/24 1609)    ED Course/ Medical Decision Making/ A&P Clinical Course as of 01/05/24 2020  Sat Jan 05, 2024  1600 Ambulated without issues [TY]    Clinical Course User Index [TY] Neysa Caron PARAS, DO                                 Medical Decision Making Is a 42-year-old male present emergency department with multiple complaints.  Is afebrile nontachycardic hemodynamically stable.  On exam, generally well-appearing.  No neurodeficits.  Clear lungs, benign abdomen.  He is moving his neck freely, no fever or  tachycardia to suggest meningitis however given duration of his headache ordered CT head.  It was negative.  Screening labs ordered showed stable anemia, borderline leukocytosis.  But no overt cause for patient's presentation.  His comprehensive metabolic panel with no significant metabolic derangements.  Normal kidney function.  No transaminitis to suggest hepatobiliary disease.  Lipase is normal.  Pancreatitis unlikely.  Reportedly had CT scan last month at outside hospital per wife's report.  I am unable to locate records.  Low suspicion for acute intra-abdominal pathology based on vitals, exam and labs.  Will forego CT scan at this time.  Patient reportedly still having hematuria, he is still urinating freely and is being followed by urology.  UA today with some mild evidence of infection.  He did get treated with Bactrim .  Will try cephalexin .  Patient's complaint of lightheadedness/dizziness seemingly more consistent with peripheral vertigo rather than central cause as it is intermittent and induced with head movement.  Given meclizine  and IV fluids.  Given constellation of symptoms possible viral etiology; ordered flu/COVID/RSV; negative.  Patient is feeling better after IV fluids and meclizine .  Will ambulate patient.  If he is ambulatory without symptoms we will discharge with antibiotics for UTI and follow-up with PCP.  Amount and/or Complexity of Data Reviewed Labs: ordered. Radiology: ordered. ECG/medicine tests: ordered.  Risk Prescription drug management.    Flu/covid negative.      Final Clinical Impression(s) / ED Diagnoses Final diagnoses:  Hematuria, unspecified type  Vertigo    Rx / DC Orders ED Discharge Orders          Ordered    meclizine  (ANTIVERT ) 25 MG tablet  3 times daily PRN,   Status:  Discontinued        01/05/24 1601    scopolamine  (TRANSDERM-SCOP) 1 MG/3DAYS  every 72 hours,   Status:  Discontinued        01/05/24 1601    cephALEXin  (KEFLEX ) 500 MG  capsule  2 times daily,   Status:  Discontinued        01/05/24 1601  ondansetron  (ZOFRAN ) 4 MG tablet  Every 6 hours,   Status:  Discontinued        01/05/24 1602    cephALEXin  (KEFLEX ) 500 MG capsule  2 times daily        01/05/24 1619    meclizine  (ANTIVERT ) 25 MG tablet  3 times daily PRN        01/05/24 1619    ondansetron  (ZOFRAN ) 4 MG tablet  Every 6 hours        01/05/24 1619    scopolamine  (TRANSDERM-SCOP) 1 MG/3DAYS  every 72 hours        01/05/24 1619              Neysa Caron PARAS, DO 01/05/24 2020    Neysa Caron PARAS, DO 01/14/24 1507

## 2024-03-06 ENCOUNTER — Other Ambulatory Visit: Payer: Self-pay | Admitting: Urology

## 2024-04-07 NOTE — Patient Instructions (Signed)
 SURGICAL WAITING ROOM VISITATION  Patients having surgery or a procedure may have no more than 2 support people in the waiting area - these visitors may rotate.    Children under the age of 34 must have an adult with them who is not the patient.  Due to an increase in RSV and influenza rates and associated hospitalizations, children ages 41 and under may not visit patients in [facility] hospitals.  Visitors with respiratory illnesses are discouraged from visiting and should remain at home.  If the patient needs to stay at the hospital during part of their recovery, the visitor guidelines for inpatient rooms apply. Pre-op nurse will coordinate an appropriate time for 1 support person to accompany patient in pre-op.  This support person may not rotate.    Please refer to the [facility] website for the visitor guidelines for Inpatients (after your surgery is over and you are in a regular room).       Your procedure is scheduled on:   04/21/2024   Report to [facility] Main Entrance    Report to admitting at  1030 AM   Call this number if you have problems the morning of surgery [phone]   Do not eat food : or drink liquids After Midnight.                             Oral Hygiene is also important to reduce your risk of infection.                                    Remember - BRUSH YOUR TEETH THE MORNING OF SURGERY WITH YOUR REGULAR TOOTHPASTE  DENTURES WILL BE REMOVED PRIOR TO SURGERY PLEASE DO NOT APPLY "Poly grip" OR ADHESIVES!!!   Do NOT smoke after Midnight   Stop all vitamins and herbal supplements 7 days before surgery.   Take these medicines the morning of surgery with A SIP OF WATER:  Inhalers as usual and bring, proscar, nebulizer if needed, flomax               Jardiance- hold for 72 hours prior to procedure.  Last dose on 04/17/24.   DO NOT TAKE ANY ORAL DIABETIC MEDICATIONS DAY OF YOUR SURGERY  Bring CPAP mask and tubing day of surgery.                               You may not have any metal on your body including hair pins, jewelry, and body piercing             Do not wear make-up, lotions, powders, perfumes/cologne, or deodorant  Do not wear nail polish including gel and S&S, artificial/acrylic nails, or any other type of covering on natural nails including finger and toenails. If you have artificial nails, gel coating, etc. that needs to be removed by a nail salon please have this removed prior to surgery or surgery may need to be canceled/ delayed if the surgeon/ anesthesia feels like they are unable to be safely monitored.   Do not shave  48 hours prior to surgery.               Men may shave face and neck.   Do not bring valuables to the hospital. [place] IS NOT  RESPONSIBLE   FOR VALUABLES.   Contacts, glasses, dentures or bridgework may not be worn into surgery.   Bring small overnight bag day of surgery.   DO NOT BRING YOUR HOME MEDICATIONS TO THE HOSPITAL. PHARMACY WILL DISPENSE MEDICATIONS LISTED ON YOUR MEDICATION LIST TO YOU DURING YOUR ADMISSION IN THE HOSPITAL!    Patients discharged on the day of surgery will not be allowed to drive home.  Someone NEEDS to stay with you for the first 24 hours after anesthesia.   Special Instructions: Bring a copy of your healthcare power of attorney and living will documents the day of surgery if you haven't scanned them before.              Please read over the following fact sheets you were given: IF YOU HAVE QUESTIONS ABOUT YOUR PRE-OP INSTRUCTIONS PLEASE CALL [phone]   If you received a COVID test during your pre-op visit  it is requested that you wear a mask when out in public, stay away from anyone that may not be feeling well and notify your surgeon if you develop symptoms. If you test positive for Covid or have been in contact with anyone that has tested positive in the last 10 days please notify you surgeon.    [place] - Preparing for  Surgery Before surgery, you can play an important role.  Because skin is not sterile, your skin needs to be as free of germs as possible.  You can reduce the number of germs on your skin by washing with CHG (chlorahexidine gluconate) soap before surgery.  CHG is an antiseptic cleaner which kills germs and bonds with the skin to continue killing germs even after washing. Please DO NOT use if you have an allergy to CHG or antibacterial soaps.  If your skin becomes reddened/irritated stop using the CHG and inform your nurse when you arrive at Short Stay. Do not shave (including legs and underarms) for at least 48 hours prior to the first CHG shower.  You may shave your face/neck. Please follow these instructions carefully:  1.  Shower with CHG Soap the night before surgery and the  morning of Surgery.  2.  If you choose to wash your hair, wash your hair first as usual with your  normal  shampoo.  3.  After you shampoo, rinse your hair and body thoroughly to remove the  shampoo.                           4.  Use CHG as you would any other liquid soap.  You can apply chg directly  to the skin and wash                       Gently with a scrungie or clean washcloth.  5.  Apply the CHG Soap to your body ONLY FROM THE NECK DOWN.   Do not use on face/ open                           Wound or open sores. Avoid contact with eyes, ears mouth and genitals (private parts).                       Wash face,  Genitals (private parts) with your normal soap.             6.  Wash thoroughly,  paying special attention to the area where your surgery  will be performed.  7.  Thoroughly rinse your body with warm water from the neck down.  8.  DO NOT shower/wash with your normal soap after using and rinsing off  the CHG Soap.                9.  Pat yourself dry with a clean towel.            10.  Wear clean pajamas.            11.  Place clean sheets on your bed the night of your first shower and do not  sleep with pets. Day  of Surgery : Do not apply any lotions/deodorants the morning of surgery.  Please wear clean clothes to the hospital/surgery center.  FAILURE TO FOLLOW THESE INSTRUCTIONS MAY RESULT IN THE CANCELLATION OF YOUR SURGERY PATIENT SIGNATURE_________________________________  NURSE SIGNATURE__________________________________  ________________________________________________________________________

## 2024-04-07 NOTE — Progress Notes (Addendum)
 Anesthesia Review:  PCP:  [facility] [place]  Cardiologist : none   PPM/ ICD: Device Orders: Rep Notified:  Chest x-ray : 1v- 01/05/24  EKG : 01/05/24  Echo : Stress test: Cardiac Cath :   Activity level: can do a flight of stairs without difficutly  Sleep Study/ CPAP : hjas cpap  Fasting Blood Sugar :      / Checks Blood Sugar -- times a day:    DM- type2- checks glucose once per week at home   Hgba1c-  04/08/24- 5.8  Jardiance- Hold  for 72 hours prior to procedure LASt dose on 04/17/24    Blood Thinner/ Instructions /Last Dose: ASA / Instructions/ Last Dose :    PT calledin on 04/17/24 and had questions in regards what meds to take am of surgery.  Called pt back and reviewed with pt to take Tamsulosin and Flomax am of surgery.  PT also aware last dose of Jardiance is on 04/17/24.  PT voiced understanding.  Instructed pt to look at preop instructions which he had at home.  PT again voiced understanding.

## 2024-04-08 ENCOUNTER — Encounter (HOSPITAL_COMMUNITY): Payer: Self-pay

## 2024-04-08 ENCOUNTER — Other Ambulatory Visit: Payer: Self-pay

## 2024-04-08 ENCOUNTER — Encounter (HOSPITAL_COMMUNITY)
Admission: RE | Admit: 2024-04-08 | Discharge: 2024-04-08 | Disposition: A | Discharge status: Home / Self Care | Source: Ambulatory Visit | Attending: Urology | Admitting: Urology

## 2024-04-08 VITALS — BP 126/80 | HR 86 | Temp 97.7°F | Resp 16 | Ht 74.0 in | Wt 228.0 lb

## 2024-04-08 DIAGNOSIS — Z01818 Encounter for other preprocedural examination: Secondary | ICD-10-CM | POA: Diagnosis present

## 2024-04-08 HISTORY — DX: Unspecified osteoarthritis, unspecified site: M19.90

## 2024-04-08 HISTORY — DX: Sleep apnea, unspecified: G47.30

## 2024-04-08 HISTORY — DX: Pneumonia, unspecified organism: J18.9

## 2024-04-08 LAB — BASIC METABOLIC PANEL WITH GFR
Anion gap: 9 (ref 5–15)
BUN: 18 mg/dL (ref 8–23)
CO2: 25 mmol/L (ref 22–32)
Calcium: 9.4 mg/dL (ref 8.9–10.3)
Chloride: 106 mmol/L (ref 98–111)
Creatinine, Ser: 1.18 mg/dL (ref 0.61–1.24)
GFR, Estimated: >60 mL/min (ref >60)
Glucose, Bld: 211 mg/dL — ABNORMAL HIGH (ref 70–99)
Potassium: 4.1 mmol/L (ref 3.5–5.1)
Sodium: 140 mmol/L (ref 135–145)

## 2024-04-08 LAB — CBC
HCT: 42.7 % (ref 39.0–52.0)
Hemoglobin: 12.9 g/dL — ABNORMAL LOW (ref 13.0–17.0)
MCH: 23.4 pg — ABNORMAL LOW (ref 26.0–34.0)
MCHC: 30.2 g/dL (ref 30.0–36.0)
MCV: 77.5 fL — ABNORMAL LOW (ref 80.0–100.0)
Platelets: 127 10*3/uL — ABNORMAL LOW (ref 150–400)
RBC: 5.51 MIL/uL (ref 4.22–5.81)
RDW: 13.2 % (ref 11.5–15.5)
WBC: 5.9 10*3/uL (ref 4.0–10.5)
nRBC: 0 % (ref 0.0–0.2)

## 2024-04-08 LAB — TYPE AND SCREEN
ABO/RH(D): B NEG
Antibody Screen: NEGATIVE

## 2024-04-08 LAB — GLUCOSE, CAPILLARY: Glucose-Capillary: 191 mg/dL — ABNORMAL HIGH (ref 70–99)

## 2024-04-08 NOTE — Progress Notes (Signed)
 Anesthesia Review:  PCP: Cardiologist :   Neuro- Ahern Video Visit on 01/02/24   PPM/ ICD: Device Orders: Rep Notified:  Chest x-ray : EKG : Echo : Stress test: Cardiac Cath :   Activity level:  Sleep Study/ CPAP : Fasting Blood Sugar :      / Checks Blood Sugar -- times a day:    Blood Thinner/ Instructions /Last Dose: ASA / Instructions/ Last Dose :

## 2024-04-09 LAB — HEMOGLOBIN A1C
Hgb A1c MFr Bld: 5.8 % — ABNORMAL HIGH (ref 4.8–5.6)
Mean Plasma Glucose: 120 mg/dL

## 2024-04-15 NOTE — Progress Notes (Signed)
 Case: [id number] Date/Time: 04/21/24 1215   Procedure: ABLATION, PROSTATE, TRANSURETHRAL, USING WATERJET   Anesthesia type: General   Diagnosis: Benign prostatic hyperplasia (BPH) with straining on urination [N40.1, R39.16]   Pre-op diagnosis: BENIGN PROSTATIC HYPERPLASIA   Location: [facility] / [facility]   Surgeons: Samson Croak, MD       DISCUSSION: Darrell Young is a 73 yo male who presents to PAT prior to surgery above. PMH of severe persistant asthma, OSA (uses CPAP), GERD, DM (A1c 5.8), anemia, arthritis.  Patient follows with Pulmonology for severe persistent asthma. Last seen on 04/02/24. Maintained on inhalers. No recent exacerbations.   LD Jardiance 4/24  VS: BP 126/80   Pulse 86   Temp 36.5 C (Oral)   Resp 16   Ht 6\' 2"  (1.88 m)   Wt 103.4 kg   SpO2 98%   BMI 29.27 kg/m   PROVIDERS: [facility]   LABS: Labs reviewed: Acceptable for surgery. (all labs ordered are listed, but only abnormal results are displayed)  Labs Reviewed  CBC - Abnormal; Notable for the following components:      Result Value   Hemoglobin 12.9 (*)    MCV 77.5 (*)    MCH 23.4 (*)    Platelets 127 (*)    All other components within normal limits  BASIC METABOLIC PANEL WITH GFR - Abnormal; Notable for the following components:   Glucose, Bld 211 (*)    All other components within normal limits  HEMOGLOBIN A1C - Abnormal; Notable for the following components:   Hgb A1c MFr Bld 5.8 (*)    All other components within normal limits  GLUCOSE, CAPILLARY - Abnormal; Notable for the following components:   Glucose-Capillary 191 (*)    All other components within normal limits  TYPE AND SCREEN     IMAGES: CXR 01/05/24:  FINDINGS: The lungs are clear without focal pneumonia, edema, pneumothorax or pleural effusion. The cardiopericardial silhouette is within normal limits for size. No acute bony abnormality.   IMPRESSION: No active disease.  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   Hyperlipidemia    Lung disease    Pneumonia    Sleep apnea    cpap    Past Surgical History:  Procedure Laterality Date   CIRCUMCISION     COLONOSCOPY N/A 08/27/2018   Procedure: COLONOSCOPY;  Surgeon: Suzette Espy, MD;  Location: [facility];  Service: Endoscopy;  Laterality: N/A;  7:30am   ELBOW SURGERY Bilateral    patient states 124 bone spurs removed   POLYPECTOMY  08/27/2018   Procedure: POLYPECTOMY;  Surgeon: Suzette Espy, MD;  Location: [facility];  Service: Endoscopy;;  rexctal polyp times 2 cs   ROTATOR CUFF REPAIR Right    TOE AMPUTATION     TUMOR REMOVAL     back of neck   tumor removed from neck      UMBILICAL HERNIA REPAIR      MEDICATIONS:  acetaminophen  (TYLENOL ) 325 MG tablet   albuterol  (PROAIR  HFA) 108 (90 Base) MCG/ACT inhaler   Ascorbic Acid (VITAMIN C) 1000 MG tablet  atorvastatin (LIPITOR) 40 MG tablet   empagliflozin (JARDIANCE) 25 MG TABS tablet   ferrous sulfate 325 (65 FE) MG tablet   finasteride (PROSCAR) 5 MG tablet   Fluticasone-Umeclidin-Vilant 200-62.5-25 MCG/ACT AEPB   Glucose 4-6 GM-MG CHEW   ipratropium-albuterol (DUONEB) 0.5-2.5 (3) MG/3ML SOLN   meclizine  (ANTIVERT ) 25 MG tablet   Multiple Vitamin (MULTIVITAMIN WITH MINERALS) TABS tablet   omalizumab (XOLAIR) 150 MG injection   ondansetron  (ZOFRAN ) 4 MG tablet   senna (SENOKOT) 8.6 MG TABS tablet   tamsulosin (FLOMAX) 0.4 MG CAPS capsule   No current facility-administered medications for this encounter.   Antoinette Kirschner [facility] Short Stay/Anesthesiology [facility] Phone [phone] 04/15/2024 2:19 PM

## 2024-04-15 NOTE — Anesthesia Preprocedure Evaluation (Addendum)
 Anesthesia Evaluation  Patient identified by MRN, date of birth, ID band Patient awake    Reviewed: Allergy & Precautions, H&P , NPO status , Patient's Chart, lab work & pertinent test results  Airway Mallampati: II   Neck ROM: full    Dental   Pulmonary asthma , sleep apnea , COPD   breath sounds clear to auscultation       Cardiovascular negative cardio ROS  Rhythm:regular Rate:Normal     Neuro/Psych    GI/Hepatic ,GERD  ,,  Endo/Other  diabetes, Type 2    Renal/GU      Musculoskeletal  (+) Arthritis ,    Abdominal   Peds  Hematology   Anesthesia Other Findings   Reproductive/Obstetrics                             Anesthesia Physical Anesthesia Plan  ASA: 2  Anesthesia Plan: General   Post-op Pain Management:    Induction: Intravenous  PONV Risk Score and Plan: 2 and Ondansetron , Dexamethasone  and Treatment may vary due to age or medical condition  Airway Management Planned: Oral ETT  Additional Equipment:   Intra-op Plan:   Post-operative Plan: Extubation in OR  Informed Consent: I have reviewed the patients History and Physical, chart, labs and discussed the procedure including the risks, benefits and alternatives for the proposed anesthesia with the patient or authorized representative who has indicated his/her understanding and acceptance.     Dental advisory given  Plan Discussed with: CRNA, Anesthesiologist and Surgeon  Anesthesia Plan Comments: (See PAT note from 4/15)        Anesthesia Quick Evaluation

## 2024-04-21 ENCOUNTER — Encounter (HOSPITAL_COMMUNITY): Payer: Self-pay | Admitting: Urology

## 2024-04-21 ENCOUNTER — Ambulatory Visit (HOSPITAL_COMMUNITY): Admitting: Medical

## 2024-04-21 ENCOUNTER — Encounter (HOSPITAL_COMMUNITY)
Admission: RE | Disposition: A | Discharge status: Home / Self Care | Payer: Self-pay | Source: Ambulatory Visit | Attending: Urology

## 2024-04-21 ENCOUNTER — Observation Stay (HOSPITAL_COMMUNITY)
Admission: RE | Admit: 2024-04-21 | Discharge: 2024-04-22 | Disposition: A | Discharge status: Home / Self Care | Source: Ambulatory Visit | Attending: Urology | Admitting: Urology

## 2024-04-21 ENCOUNTER — Ambulatory Visit (HOSPITAL_BASED_OUTPATIENT_CLINIC_OR_DEPARTMENT_OTHER): Admitting: Certified Registered Nurse Anesthetist

## 2024-04-21 ENCOUNTER — Other Ambulatory Visit: Payer: Self-pay

## 2024-04-21 DIAGNOSIS — R3916 Straining to void: Secondary | ICD-10-CM | POA: Diagnosis not present

## 2024-04-21 DIAGNOSIS — N138 Other obstructive and reflux uropathy: Secondary | ICD-10-CM | POA: Insufficient documentation

## 2024-04-21 DIAGNOSIS — R3915 Urgency of urination: Secondary | ICD-10-CM | POA: Diagnosis not present

## 2024-04-21 DIAGNOSIS — G4730 Sleep apnea, unspecified: Secondary | ICD-10-CM | POA: Insufficient documentation

## 2024-04-21 DIAGNOSIS — R350 Frequency of micturition: Secondary | ICD-10-CM | POA: Insufficient documentation

## 2024-04-21 DIAGNOSIS — R3912 Poor urinary stream: Secondary | ICD-10-CM | POA: Insufficient documentation

## 2024-04-21 DIAGNOSIS — Z7984 Long term (current) use of oral hypoglycemic drugs: Secondary | ICD-10-CM | POA: Insufficient documentation

## 2024-04-21 DIAGNOSIS — N401 Enlarged prostate with lower urinary tract symptoms: Principal | ICD-10-CM | POA: Insufficient documentation

## 2024-04-21 DIAGNOSIS — N400 Enlarged prostate without lower urinary tract symptoms: Principal | ICD-10-CM | POA: Diagnosis present

## 2024-04-21 DIAGNOSIS — R3914 Feeling of incomplete bladder emptying: Secondary | ICD-10-CM | POA: Diagnosis not present

## 2024-04-21 DIAGNOSIS — Z01818 Encounter for other preprocedural examination: Secondary | ICD-10-CM

## 2024-04-21 DIAGNOSIS — E119 Type 2 diabetes mellitus without complications: Secondary | ICD-10-CM | POA: Diagnosis not present

## 2024-04-21 DIAGNOSIS — J4489 Other specified chronic obstructive pulmonary disease: Secondary | ICD-10-CM | POA: Insufficient documentation

## 2024-04-21 DIAGNOSIS — R351 Nocturia: Secondary | ICD-10-CM | POA: Diagnosis not present

## 2024-04-21 DIAGNOSIS — Z79899 Other long term (current) drug therapy: Secondary | ICD-10-CM | POA: Insufficient documentation

## 2024-04-21 LAB — GLUCOSE, CAPILLARY
Glucose-Capillary: 86 mg/dL (ref 70–99)
Glucose-Capillary: 72 mg/dL (ref 70–99)
Glucose-Capillary: 90 mg/dL (ref 70–99)
Glucose-Capillary: 216 mg/dL — ABNORMAL HIGH (ref 70–99)

## 2024-04-21 SURGERY — ABLATION, PROSTATE, TRANSURETHRAL, USING WATERJET
Anesthesia: General

## 2024-04-21 SURGERY — Surgical Case
Anesthesia: *Unknown

## 2024-04-21 MED ORDER — OXYCODONE HCL 5 MG/5ML PO SOLN: 5.0000 mg | Freq: Once | ORAL | Status: DC | PRN

## 2024-04-21 MED ORDER — ADULT MULTIVITAMIN W/MINERALS CH
1.0000 | ORAL_TABLET | Freq: Every day | ORAL | Status: DC
  Administered 2024-04-22: 1 via ORAL
  Filled 2024-04-21: qty 1

## 2024-04-21 MED ORDER — AMOXICILLIN-POT CLAVULANATE 875-125 MG PO TABS: 1.0000 | ORAL_TABLET | Freq: Two times a day (BID) | ORAL | 0 refills | Status: AC

## 2024-04-21 MED ORDER — TRANEXAMIC ACID-NACL 1000-0.7 MG/100ML-% IV SOLN
1000.0000 mg | INTRAVENOUS | Status: AC
  Administered 2024-04-21: 1000 mg via INTRAVENOUS
  Filled 2024-04-21: qty 100

## 2024-04-21 MED ORDER — SODIUM CHLORIDE 0.9 % IR SOLN
Status: DC | PRN
  Administered 2024-04-21: 15000 mL

## 2024-04-21 MED ORDER — LACTATED RINGERS IV SOLN: INTRAVENOUS | Status: DC

## 2024-04-21 MED ORDER — DIPHENHYDRAMINE HCL 12.5 MG/5ML PO ELIX: 12.5000 mg | ORAL_SOLUTION | Freq: Four times a day (QID) | ORAL | Status: DC | PRN

## 2024-04-21 MED ORDER — ONDANSETRON HCL 4 MG/2ML IJ SOLN: 4.0000 mg | Freq: Four times a day (QID) | INTRAMUSCULAR | Status: DC | PRN

## 2024-04-21 MED ORDER — FENTANYL CITRATE (PF) 100 MCG/2ML IJ SOLN
INTRAMUSCULAR | Status: AC
  Filled 2024-04-21: qty 2

## 2024-04-21 MED ORDER — LIDOCAINE HCL (PF) 2 % IJ SOLN
INTRAMUSCULAR | Status: AC
  Filled 2024-04-21: qty 5

## 2024-04-21 MED ORDER — ALBUTEROL SULFATE (2.5 MG/3ML) 0.083% IN NEBU: 2.5000 mg | INHALATION_SOLUTION | RESPIRATORY_TRACT | Status: DC

## 2024-04-21 MED ORDER — SODIUM CHLORIDE 0.9 % IR SOLN
3000.0000 mL | Status: DC
  Administered 2024-04-21: 3000 mL

## 2024-04-21 MED ORDER — INSULIN ASPART 100 UNIT/ML IJ SOLN
0.0000 [IU] | Freq: Three times a day (TID) | INTRAMUSCULAR | Status: DC
  Administered 2024-04-22: 3 [IU] via SUBCUTANEOUS

## 2024-04-21 MED ORDER — OXYCODONE HCL 5 MG PO TABS
5.0000 mg | ORAL_TABLET | ORAL | Status: DC | PRN
  Administered 2024-04-21 – 2024-04-22 (×5): 5 mg via ORAL
  Filled 2024-04-21 (×5): qty 1

## 2024-04-21 MED ORDER — ONDANSETRON HCL 4 MG/2ML IJ SOLN
INTRAMUSCULAR | Status: DC | PRN
  Administered 2024-04-21: 4 mg via INTRAVENOUS

## 2024-04-21 MED ORDER — PHENYLEPHRINE 80 MCG/ML (10ML) SYRINGE FOR IV PUSH (FOR BLOOD PRESSURE SUPPORT)
PREFILLED_SYRINGE | INTRAVENOUS | Status: DC | PRN
  Administered 2024-04-21: 80 ug via INTRAVENOUS

## 2024-04-21 MED ORDER — ROCURONIUM BROMIDE 10 MG/ML (PF) SYRINGE
PREFILLED_SYRINGE | INTRAVENOUS | Status: AC
  Filled 2024-04-21: qty 10

## 2024-04-21 MED ORDER — ONDANSETRON HCL 4 MG/2ML IJ SOLN: 4.0000 mg | INTRAMUSCULAR | Status: DC | PRN

## 2024-04-21 MED ORDER — ONDANSETRON HCL 4 MG/2ML IJ SOLN
INTRAMUSCULAR | Status: AC
  Filled 2024-04-21: qty 2

## 2024-04-21 MED ORDER — ZOLPIDEM TARTRATE 5 MG PO TABS: 5.0000 mg | ORAL_TABLET | Freq: Every evening | ORAL | Status: DC | PRN

## 2024-04-21 MED ORDER — HYDROCODONE-ACETAMINOPHEN 5-325 MG PO TABS: 1.0000 | ORAL_TABLET | Freq: Four times a day (QID) | ORAL | 0 refills | Status: AC | PRN

## 2024-04-21 MED ORDER — LIDOCAINE HCL (PF) 2 % IJ SOLN
INTRAMUSCULAR | Status: DC | PRN
  Administered 2024-04-21: 60 mg via INTRADERMAL

## 2024-04-21 MED ORDER — FENTANYL CITRATE PF 50 MCG/ML IJ SOSY: 25.0000 ug | PREFILLED_SYRINGE | INTRAMUSCULAR | Status: DC | PRN

## 2024-04-21 MED ORDER — FINASTERIDE 5 MG PO TABS
5.0000 mg | ORAL_TABLET | Freq: Every day | ORAL | Status: DC
  Administered 2024-04-22: 5 mg via ORAL
  Filled 2024-04-21: qty 1

## 2024-04-21 MED ORDER — PROPOFOL 10 MG/ML IV BOLUS
INTRAVENOUS | Status: AC
  Filled 2024-04-21: qty 20

## 2024-04-21 MED ORDER — STERILE WATER FOR IRRIGATION IR SOLN
Status: DC | PRN
  Administered 2024-04-21: 1

## 2024-04-21 MED ORDER — DIPHENHYDRAMINE HCL 50 MG/ML IJ SOLN: 12.5000 mg | Freq: Four times a day (QID) | INTRAMUSCULAR | Status: DC | PRN

## 2024-04-21 MED ORDER — FENTANYL CITRATE (PF) 100 MCG/2ML IJ SOLN
INTRAMUSCULAR | Status: DC | PRN
  Administered 2024-04-21: 50 ug via INTRAVENOUS
  Administered 2024-04-21: 100 ug via INTRAVENOUS

## 2024-04-21 MED ORDER — BUDESON-GLYCOPYRROL-FORMOTEROL 160-9-4.8 MCG/ACT IN AERO
2.0000 | INHALATION_SPRAY | Freq: Two times a day (BID) | RESPIRATORY_TRACT | Status: DC
  Filled 2024-04-21: qty 5.9

## 2024-04-21 MED ORDER — ALBUTEROL SULFATE HFA 108 (90 BASE) MCG/ACT IN AERS: 2.0000 | INHALATION_SPRAY | RESPIRATORY_TRACT | Status: DC

## 2024-04-21 MED ORDER — ATORVASTATIN CALCIUM 40 MG PO TABS
40.0000 mg | ORAL_TABLET | Freq: Every day | ORAL | Status: DC
  Administered 2024-04-21: 40 mg via ORAL
  Filled 2024-04-21: qty 1

## 2024-04-21 MED ORDER — 0.9 % SODIUM CHLORIDE (POUR BTL) OPTIME
TOPICAL | Status: DC | PRN
  Administered 2024-04-21: 1000 mL

## 2024-04-21 MED ORDER — ORAL CARE MOUTH RINSE: 15.0000 mL | Freq: Once | OROMUCOSAL | Status: AC

## 2024-04-21 MED ORDER — MORPHINE SULFATE (PF) 2 MG/ML IV SOLN
2.0000 mg | INTRAVENOUS | Status: DC | PRN
  Administered 2024-04-21 (×2): 2 mg via INTRAVENOUS
  Filled 2024-04-21 (×2): qty 1

## 2024-04-21 MED ORDER — HYOSCYAMINE SULFATE 0.125 MG SL SUBL
0.1250 mg | SUBLINGUAL_TABLET | SUBLINGUAL | Status: DC | PRN
  Administered 2024-04-21 – 2024-04-22 (×3): 0.125 mg via SUBLINGUAL
  Filled 2024-04-21 (×3): qty 1

## 2024-04-21 MED ORDER — CHLORHEXIDINE GLUCONATE 0.12 % MT SOLN
15.0000 mL | Freq: Once | OROMUCOSAL | Status: AC
  Administered 2024-04-21: 15 mL via OROMUCOSAL

## 2024-04-21 MED ORDER — EPHEDRINE SULFATE-NACL 50-0.9 MG/10ML-% IV SOSY
PREFILLED_SYRINGE | INTRAVENOUS | Status: DC | PRN
  Administered 2024-04-21: 5 mg via INTRAVENOUS
  Administered 2024-04-21: 7.5 mg via INTRAVENOUS

## 2024-04-21 MED ORDER — SODIUM CHLORIDE 0.9 % IV SOLN
2.0000 g | INTRAVENOUS | Status: AC
Start: 2024-04-21 — End: 2024-04-21
  Administered 2024-04-21: 2 g via INTRAVENOUS
  Filled 2024-04-21: qty 20

## 2024-04-21 MED ORDER — EPHEDRINE 5 MG/ML INJ
INTRAVENOUS | Status: AC
  Filled 2024-04-21: qty 5

## 2024-04-21 MED ORDER — SUGAMMADEX SODIUM 200 MG/2ML IV SOLN
INTRAVENOUS | Status: DC | PRN
Start: 2024-04-21 — End: 2024-04-21
  Administered 2024-04-21: 220 mg via INTRAVENOUS

## 2024-04-21 MED ORDER — DEXAMETHASONE SODIUM PHOSPHATE 10 MG/ML IJ SOLN
INTRAMUSCULAR | Status: DC | PRN
  Administered 2024-04-21: 5 mg via INTRAVENOUS

## 2024-04-21 MED ORDER — TRIPLE ANTIBIOTIC 3.5-400-5000 EX OINT: 1.0000 | TOPICAL_OINTMENT | Freq: Three times a day (TID) | CUTANEOUS | Status: DC | PRN

## 2024-04-21 MED ORDER — IPRATROPIUM-ALBUTEROL 0.5-2.5 (3) MG/3ML IN SOLN
3.0000 mL | Freq: Two times a day (BID) | RESPIRATORY_TRACT | Status: DC
  Administered 2024-04-22: 3 mL via RESPIRATORY_TRACT
  Filled 2024-04-21: qty 3

## 2024-04-21 MED ORDER — PROPOFOL 10 MG/ML IV BOLUS
INTRAVENOUS | Status: DC | PRN
  Administered 2024-04-21: 150 mg via INTRAVENOUS
  Administered 2024-04-21: 50 mg via INTRAVENOUS

## 2024-04-21 MED ORDER — FERROUS SULFATE 325 (65 FE) MG PO TABS
325.0000 mg | ORAL_TABLET | Freq: Every day | ORAL | Status: DC
  Administered 2024-04-22: 325 mg via ORAL
  Filled 2024-04-21: qty 1

## 2024-04-21 MED ORDER — SODIUM CHLORIDE 0.9 % IV SOLN: INTRAVENOUS | Status: DC

## 2024-04-21 MED ORDER — ROCURONIUM BROMIDE 10 MG/ML (PF) SYRINGE
PREFILLED_SYRINGE | INTRAVENOUS | Status: DC | PRN
  Administered 2024-04-21: 60 mg via INTRAVENOUS
  Administered 2024-04-21: 20 mg via INTRAVENOUS

## 2024-04-21 MED ORDER — INSULIN ASPART 100 UNIT/ML IJ SOLN: 0.0000 [IU] | INTRAMUSCULAR | Status: DC | PRN

## 2024-04-21 MED ORDER — OXYCODONE HCL 5 MG PO TABS: 5.0000 mg | ORAL_TABLET | Freq: Once | ORAL | Status: DC | PRN

## 2024-04-21 MED ORDER — DEXAMETHASONE SODIUM PHOSPHATE 10 MG/ML IJ SOLN
INTRAMUSCULAR | Status: AC
  Filled 2024-04-21: qty 1

## 2024-04-21 MED ORDER — SENNOSIDES-DOCUSATE SODIUM 8.6-50 MG PO TABS
2.0000 | ORAL_TABLET | Freq: Every day | ORAL | Status: DC
  Administered 2024-04-21: 2 via ORAL
  Filled 2024-04-21 (×2): qty 2

## 2024-04-21 MED ORDER — ACETAMINOPHEN 325 MG PO TABS: 650.0000 mg | ORAL_TABLET | ORAL | Status: DC | PRN

## 2024-04-21 SURGICAL SUPPLY — 25 items
BAG URINE DRAIN 2000ML AR STRL (UROLOGICAL SUPPLIES) ×1 IMPLANT
CANISTER SUCT 3000ML PPV (MISCELLANEOUS) ×1 IMPLANT
CATH HEMA 3WAY 30CC 22FR COUDE (CATHETERS) IMPLANT
CATH HEMA 3WAY 30CC 24FR COUDE (CATHETERS) IMPLANT
CATH URTH STD 24FR FL 3W 2 (CATHETERS) IMPLANT
COVER MAYO STAND STRL (DRAPES) ×1 IMPLANT
DRAPE FOOT SWITCH (DRAPES) ×1 IMPLANT
GEL ULTRASOUND 8.5O AQUASONIC (MISCELLANEOUS) ×1 IMPLANT
GLOVE SURG LX STRL 7.5 STRW (GLOVE) ×1 IMPLANT
GOWN STRL REUS W/ TWL XL LVL3 (GOWN DISPOSABLE) ×1 IMPLANT
HANDPIECE AQUABEAM (MISCELLANEOUS) ×1 IMPLANT
HOLDER FOLEY CATH W/STRAP (MISCELLANEOUS) IMPLANT
KIT TURNOVER KIT A (KITS) IMPLANT
LOOP CUT BIPOLAR 24F LRG (ELECTROSURGICAL) IMPLANT
MANIFOLD NEPTUNE II (INSTRUMENTS) ×1 IMPLANT
MAT ABSORB FLUID 56X50 GRAY (MISCELLANEOUS) ×1 IMPLANT
PACK CYSTO (CUSTOM PROCEDURE TRAY) ×1 IMPLANT
PACK DRAPE AQUABEAM (MISCELLANEOUS) ×1 IMPLANT
PAD PREP 24X48 CUFFED NSTRL (MISCELLANEOUS) ×1 IMPLANT
SYR 30ML LL (SYRINGE) ×1 IMPLANT
SYRINGE TOOMEY IRRIG 70ML (MISCELLANEOUS) ×2 IMPLANT
TOWEL OR 17X26 10 PK STRL BLUE (TOWEL DISPOSABLE) ×1 IMPLANT
TUBING CONNECTING 10 (TUBING) ×2 IMPLANT
TUBING UROLOGY SET (TUBING) ×1 IMPLANT
UNDERPAD 30X36 HEAVY ABSORB (UNDERPADS AND DIAPERS) ×1 IMPLANT

## 2024-04-21 NOTE — Anesthesia Procedure Notes (Signed)
 Procedure Name: Intubation Date/Time: 04/21/2024 1:33 PM  Performed by: Manuela Sella, CRNAPre-anesthesia Checklist: Patient identified, Emergency Drugs available, Suction available and Patient being monitored Patient Re-evaluated:Patient Re-evaluated prior to induction Oxygen Delivery Method: Circle system utilized Preoxygenation: Pre-oxygenation with 100% oxygen Induction Type: IV induction Ventilation: Mask ventilation without difficulty Laryngoscope Size: Glidescope and 4 Grade View: Grade I Tube type: Oral Tube size: 7.5 mm Number of attempts: 1 Airway Equipment and Method: Stylet Placement Confirmation: ETT inserted through vocal cords under direct vision, positive ETCO2 and breath sounds checked- equal and bilateral Secured at: 23 cm Tube secured with: Tape Dental Injury: Teeth and Oropharynx as per pre-operative assessment  Difficulty Due To: Difficulty was anticipated and Difficult Airway- due to reduced neck mobility

## 2024-04-21 NOTE — H&P (Signed)
 CC/HPI: CC: Gross hematuria  HPI:  01/03/2024  73 year old male started experiencing gross hematuria on 12/15/2023. Was having some almost constant dripping from the penis and having to wear a pad at the time. Was having some gross hematuria up until yesterday. Has some microscopic hematuria today. Was painless in nature. Denies any dysuria. Does have some associated sensation of incomplete bladder emptying, frequency, intermittency, urgency, mildly weak stream, straining to void, nocturia x 3. [facility] but has not received it yet. Denies a history of tobacco use. Denies any blood thinner use. Underwent CT scan of the abdomen pelvis without contrast on 12/20/2023 that revealed no obvious renal or ureteral calculi. No hydronephrosis. No obvious bladder abnormality. Had prostatic calcifications and enlarged prostate.PSA was 3.8 back in August.   03/05/2024  Patient underwent a UroCuff that revealed the following:   UroCuff Summary  Patient's desire to void was 9 on a scale of 1 to 10. Patient voided 149 mL. The maximum flow (Qmax) was 14.0 mL/s and highest pressure at flow interruption (PcuffInt) was 200.042 cm H2O. Abdominal and perianal EMG was measured during the study. The calculated pressure-flow study (PFS) score was 42%. An automatic analysis algorithm calculated the pressure/flow point based on the inflations in the study. When plotted onto the modified nomogram this calculated pressure/flow point lies in the nomogram quadrant that suggests a finding of High Pressure/High Flow.   PVR was 75.   Patient started tamsulosin and finasteride. He noted some significant improvement but continues to have significant symptoms including incomplete bladder emptying, frequency, intermittency, urgency, weak stream, nocturia x 2. AUA symptom score remains elevated at 25.   Prostate size by CT was 7.5 x 5.9 x 6.4, 147 cc.    04/21/2024 Patient presents for  aquablation.    ALLERGIES: Aspirin    MEDICATIONS: Finasteride 5 mg tablet 1 tablet PO Daily  Metformin Hcl  Tamsulosin Hcl 0.4 mg capsule 1 capsule PO Daily  Atorvastatin Calcium  Azelastine Hcl  Fluticasone Propionate  Trelegy Ellipta  Vitamin C  Xolair     GU PSH: Complex cystometrogram, with voiding pressure studies, any technique - 02/04/2024 Complex Uroflow - 02/04/2024 Cystoscopy - 01/03/2024 Emg surf Electrd - 02/04/2024       PSH Notes: Umbilical surgery  Lipoma  toe amputation  elbow surgery      NON-GU PSH: No Non-GU PSH    GU PMH: BPH w/LUTS - 02/04/2024, - 01/03/2024 Nocturia - 02/04/2024, - 01/03/2024 Urinary Frequency - 02/04/2024, - 01/03/2024 Urinary Urgency - 02/04/2024, - 01/03/2024 Weak Urinary Stream - 02/04/2024, - 01/03/2024 Gross hematuria - 01/03/2024    NON-GU PMH: Arthritis Asthma Diabetes Type 2 GERD Hypercholesterolemia Liver Disease Sickle-cell trait Sleep Apnea    FAMILY HISTORY: Alcoholism - Father Diabetes - Runs in Family Heart Disease - Runs in Family hyperlipidemia - Runs in Family Prostate Cancer - Brother   SOCIAL HISTORY: Marital Status: Married Preferred Language: English; Race: White Current Smoking Status: Patient has never smoked.   Tobacco Use Assessment Completed: Used Tobacco in last 30 days? Drinks 1 caffeinated drink per day.    REVIEW OF SYSTEMS:    GU Review Male:   Patient denies frequent urination, hard to postpone urination, burning/ pain with urination, get up at night to urinate, leakage of urine, stream starts and stops, trouble starting your stream, have to strain to urinate , erection problems, and penile pain.  Gastrointestinal (Upper):   Patient denies nausea, vomiting, and indigestion/ heartburn.  Gastrointestinal (Lower):   Patient denies diarrhea and constipation.  Constitutional:   Patient denies fever, night sweats, weight loss, and fatigue.  Skin:   Patient denies skin rash/ lesion and itching.  Eyes:    Patient denies blurred vision and double vision.  Ears/ Nose/ Throat:   Patient denies sore throat and sinus problems.  Hematologic/Lymphatic:   Patient denies swollen glands and easy bruising.  Cardiovascular:   Patient denies leg swelling and chest pains.  Respiratory:   Patient denies shortness of breath and cough.  Endocrine:   Patient denies excessive thirst.  Musculoskeletal:   Patient denies back pain and joint pain.  Neurological:   Patient denies headaches and dizziness.  Psychologic:   Patient denies depression and anxiety.    MULTI-SYSTEM PHYSICAL EXAMINATION:    Constitutional: Well-nourished. No physical deformities. Normally developed. Good grooming.  Gastrointestinal: No mass, no tenderness, no rigidity, non obese abdomen.  Eyes: Normal conjunctivae. Normal eyelids.  Musculoskeletal: Normal gait and station of head and neck.       ASSESSMENT:      ICD-10 Details  1 GU:   BPH w/LUTS - N40.1 Chronic, Stable  2   Urinary Frequency - R35.0 Chronic, Stable  3   Nocturia - R35.1 Chronic, Stable  4   Urinary Urgency - R39.15 Chronic, Stable  5   Weak Urinary Stream - R39.12 Chronic, Stable  6   Incomplete bladder emptying - R39.14 Chronic, Stable   PLAN:    The patient has failed medical management for his lower urinary tract symptoms. He would like to proceed with surgical resection. I discussed aquablation of the prostate. I specifically discussed the risks including but not limited to bleeding which could require blood transfusion, infection, and injury to surrounding structures. Also discussed the possibility that the surgery would not improve symptoms though most men have a great improvement in their symptoms. Also discussed the low likelihood but possibility of development of new symptoms such as irritative voiding symptoms or urinary incontinence. Most men will have some degree of urinary urgency and discomfort immediately following the surgery that resolves in a short  amount of time. He understands that most often this is an outpatient procedure but occasionally patients require hospitalization for continuous bladder irrigation in the event of excess bleeding. He also understands the possibility of being sent home with a urethral catheter. The patient expressed understanding and is eager to proceed.

## 2024-04-21 NOTE — Transfer of Care (Signed)
 Immediate Anesthesia Transfer of Care Note  Patient: Darrell Young  Procedure(s) Performed: ABLATION, PROSTATE, TRANSURETHRAL, USING WATERJET  Patient Location: PACU  Anesthesia Type:General  Level of Consciousness: drowsy  Airway & Oxygen Therapy: Patient Spontanous Breathing and Patient connected to face mask oxygen  Post-op Assessment: Report given to RN, Post -op Vital signs reviewed and stable, and Patient moving all extremities X 4  Post vital signs: Reviewed and stable  Last Vitals:  Vitals Value Taken Time  BP 133/80   Temp    Pulse 65 04/21/24 1508  Resp 0 04/21/24 1508  SpO2 100 % 04/21/24 1508  Vitals shown include unfiled device data.  Last Pain:  Vitals:   04/21/24 1032  TempSrc: Oral  PainSc: 0-No pain         Complications:  Encounter Notable Events  Notable Event Outcome Phase Comment  Difficult to intubate - expected  Intraprocedure Filed from anesthesia note documentation.

## 2024-04-21 NOTE — Op Note (Signed)
 Preoperative diagnosis: BPH with lower urinary tract symptoms, weak stream, frequency  Postoperative diagnosis: Same   Procedure: Robotic water  jet ablation of the prostate   Surgeon: Leila Punt, MD  Anesthesia: General   Indication for procedure:   Findings:  Cystoscopy revealed severe bilobar hypertrophy with obstructing lateral lobes.  Description of procedure:  He was brought to the operating room and placed supine on the operating table.  After adequate anesthesia he was placed lithotomy position. Timeout was performed to confirm the patient and procedure. The TRUS Stepper was mounted to the Articulating Arm and secured to OR bed. The ultrasound probe was attached to the stepper. Exam under anesthesia was performed and the TRUS was inserted per rectum.  There was no resistance. The ultrasound probe was aligned, and confirmation made that the prostate is centered and aligned using both transverse and sagittal views. The bladder neck, verumontanum and the central/transition zones were identified.  Genitalia were prepped and draped in the usual sterile fashion. The 9F AQUABEAM Handpiece is inserted into the prostatic urethra and a complete cystoscopic evaluation was performed by inspecting the prostate, bladder, and identifying the location of the verumontanum/external sphincter. The AQUABEAM Handpiece was secured to the Handpiece Articulating Arm. Confirmed alignment of AQUABEAM Handpiece and TRUS Probe to be parallel and colinear. Confirmation that AQUABEAM nozzle is centered and anterior of the bladder neck or the median lobe. The cystoscope was then retracted to visualize the verumontanum and external sphincter and the cystoscope tip was positioned just proximal to the external sphincter. Reconfirmed alignment of the TRUS probe with the AQUABEAM Handpiece and compression applied with TRUS probe. Horizontal alignment of the Handpiece waterjet nozzle was performed. The Aquablation treatment  zones were planned utilizing real-time TRUS to visualize the contour of the prostate and the depth and radial angles of resection were defined in the transverse view. In the sagittal view, the AQUABEAM nozzle is identified and position registered with software. The treatment contours were then adjusted to conform to the intended resection margins. The median lobe, bladder neck and verumontanum were marked and confirmed in the treatment contour. The Aquablation Treatment was then started following the resection contour confirmed under ultrasound guidance.  Once Aquablation resection was complete the 24 French aqua beam handpiece was carefully removed.  The continuous-flow sheath with the visual obturator was passed and then the loop and handle.  The trigone and the ureteral orifices were identified.  Resection of some of the residual median lobe and bladder neck tissue was done.  The bladder neck was identified at 6:00 and this was taken up to 12:00 with fulguration of the bladder neck and prostate for hemostasis.  Similarly from 6:00 up to 12:00 on the left side of the bladder neck was identified by resecting some of the ablated tissue to identify the bladder neck and cauterize any bleeding.  Some anterior tissue was resected.  This created excellent hemostasis.  All the chips were evacuated.  Ureteral orifices again identified and noted to be normal without injury.  The scope was backed out and a 24 [place] hematuria catheter was placed with 30 cc in the balloon.  The balloon was seated at the bladder neck and it was irrigated on light traction and noted to be clear to pink.  He was hooked up to CBI.  He was cleaned up and placed supine.  Catheter was placed on traction.  He was awakened and taken to the cover room in stable condition.  Complications: None  Blood loss:  100 mL  Specimens: None  Drains: 24 [place] three-way hematuria catheter with 30 cc in the balloon  Disposition: Patient stable to  PACU

## 2024-04-21 NOTE — Discharge Instructions (Signed)
 Transurethral Resection of the Prostate (TURP) or Greenlight laser ablation of the Prostate  Care After  Refer to this sheet in the next few weeks. These discharge instructions provide you with general information on caring for yourself after you leave the hospital. Your caregiver may also give you specific instructions. Your treatment has been planned according to the most current medical practices available, but unavoidable complications sometimes occur. If you have any problems or questions after discharge, please call your caregiver.  HOME CARE INSTRUCTIONS   Medications  You may receive medicine for pain management. As your level of discomfort decreases, adjustments in your pain medicines may be made.   Take all medicines as directed.   You may be given a medicine (antibiotic) to kill germs following surgery. Finish all medicines. Let your caregiver know if you have any side effects or problems from the medicine.   If you are on aspirin, it would be best not to restart the aspirin until the blood in the urine clears Hygiene  You can take a shower after surgery.   You should not take a bath while you still have the urethral catheter. Activity  You will be encouraged to get out of bed as much as possible and increase your activity level as tolerated.   Spend the first week in and around your home. For 3 weeks, avoid the following:   Straining.   Running.   Strenuous work.   Walks longer than a few blocks.   Riding for extended periods.   Sexual relations.   Do not lift heavy objects (more than 20 pounds) for at least 1 month. When lifting, use your arms instead of your abdominal muscles.   You will be encouraged to walk as tolerated. Do not exert yourself. Increase your activity level slowly. Remember that it is important to keep moving after an operation of any type. This cuts down on the possibility of developing blood clots.   Your caregiver will tell you when you  can resume driving and light housework. Discuss this at your first office visit after discharge. Diet  No special diet is ordered after a TURP. However, if you are on a special diet for another medical problem, it should be continued.   Normal fluid intake is usually recommended.   Avoid alcohol and caffeinated drinks for 2 weeks. They irritate the bladder. Decaffeinated drinks are okay.   Avoid spicy foods.  Bladder Function  For the first 10 days, empty the bladder whenever you feel a definite desire. Do not try to hold the urine for long periods of time.   Urinating once or twice a night even after you are healed is not uncommon.   You may see some recurrence of blood in the urine after discharge from the hospital. This usually happens within 2 weeks after the procedure.If this occurs, force fluids again as you did in the hospital and reduce your activity.  Bowel Function  You may experience some constipation after surgery. This can be minimized by increasing fluids and fiber in your diet. Drink enough water and fluids to keep your urine clear or pale yellow.   A stool softener may be prescribed for use at home. Do not strain to move your bowels.   If you are requiring increased pain medicine, it is important that you take stool softeners to prevent constipation. This will help to promote proper healing by reducing the need to strain to move your bowels.  Sexual Activity  Semen movement  in the opposite direction and into the bladder (retrograde ejaculation) may occur. Since the semen passes into the bladder, cloudy urine can occur the first time you urinate after intercourse. Or, you may not have an ejaculation during erection. Ask your caregiver when you can resume sexual activity. Retrograde ejaculation and reduced semen discharge should not reduce one's pleasure of intercourse.  Postoperative Visit  Arrange the date and time of your after surgery visit with your caregiver.  Return  to Work  After your recovery is complete, you will be able to return to work and resume all activities. Your caregiver will inform you when you can return to work.    Foley Catheter Care A soft, flexible tube (Foley catheter) may have been placed in your bladder to drain urine and fluid. Follow these instructions: Taking Care of the Catheter  Keep the area where the catheter leaves your body clean.   Attach the catheter to the leg so there is no tension on the catheter.   Keep the drainage bag below the level of the bladder, but keep it OFF the floor.   Do not take long soaking baths. Your caregiver will give instructions about showering.   Wash your hands before touching ANYTHING related to the catheter or bag.   Using mild soap and warm water on a washcloth:   Clean the area closest to the catheter insertion site using a circular motion around the catheter.   Clean the catheter itself by wiping AWAY from the insertion site for several inches down the tube.   NEVER wipe upward as this could sweep bacteria up into the urethra (tube in your body that normally drains the bladder) and cause infection.   Place a small amount of sterile lubricant at the tip of the penis where the catheter is entering.  Taking Care of the Drainage Bags  Two drainage bags may be taken home: a large overnight drainage bag, and a smaller leg bag which fits underneath clothing.   It is okay to wear the overnight bag at any time, but NEVER wear the smaller leg bag at night.   Keep the drainage bag well below the level of your bladder. This prevents backflow of urine into the bladder and allows the urine to drain freely.   Anchor the tubing to your leg to prevent pulling or tension on the catheter. Use tape or a leg strap provided by the hospital.   Empty the drainage bag when it is 1/2 to 3/4 full. Wash your hands before and after touching the bag.   Periodically check the tubing for kinks to make sure  there is no pressure on the tubing which could restrict the flow of urine.  Changing the Drainage Bags  Cleanse both ends of the clean bag with alcohol before changing.   Pinch off the rubber catheter to avoid urine spillage during the disconnection.   Disconnect the dirty bag and connect the clean one.   Empty the dirty bag carefully to avoid a urine spill.   Attach the new bag to the leg with tape or a leg strap.  Cleaning the Drainage Bags  Whenever a drainage bag is disconnected, it must be cleaned quickly so it is ready for the next use.   Wash the bag in warm, soapy water.   Rinse the bag thoroughly with warm water.   Soak the bag for 30 minutes in a solution of white vinegar and water (1 cup vinegar to 1 quart warm  water).   Rinse with warm water.  SEEK MEDICAL CARE IF:   You have chills or night sweats.   You are leaking around your catheter or have problems with your catheter. It is not uncommon to have sporadic leakage around your catheter as a result of bladder spasms. If the leakage stops, there is not much need for concern. If you are uncertain, call your caregiver.   You develop side effects that you think are coming from your medicines.  SEEK IMMEDIATE MEDICAL CARE IF:   You are suddenly unable to urinate. Check to see if there are any kinks in the drainage tubing that may cause this. If you cannot find any kinks, call your caregiver immediately. This is an emergency.   You develop shortness of breath or chest pains.   Bleeding persists or clots develop in your urine.   You have a fever.   You develop pain in your back or over your lower belly (abdomen).   You develop pain or swelling in your legs.   Any problems you are having get worse rather than better.  MAKE SURE YOU:   Understand these instructions.   Will watch your condition.   Will get help right away if you are not doing well or get worse.

## 2024-04-22 DIAGNOSIS — N401 Enlarged prostate with lower urinary tract symptoms: Secondary | ICD-10-CM | POA: Diagnosis not present

## 2024-04-22 LAB — GLUCOSE, CAPILLARY: Glucose-Capillary: 183 mg/dL — ABNORMAL HIGH (ref 70–99)

## 2024-04-22 NOTE — Discharge Summary (Signed)
 Patient ID: Jayquon Sharrett MRN: [id number] DOB/AGE: 1951-12-14 73 y.o.  Admit date: 04/21/2024 Discharge date: 04/22/2024  Primary Care Physician:  [facility]  Discharge Diagnoses:   Present on Admission:  BPH (benign prostatic hyperplasia)  Discharge Medications: Allergies as of 04/22/2024       Reactions   Aspirin    GI upset   Shrimp (diagnostic) Hives   With Vein        Medication List     TAKE these medications    acetaminophen  325 MG tablet Commonly known as: Tylenol  Take 2 tablets (650 mg total) by mouth every 6 (six) hours as needed for up to 30 doses for mild pain or moderate pain.   amoxicillin-clavulanate 875-125 MG tablet Commonly known as: AUGMENTIN Take 1 tablet by mouth 2 (two) times daily.   atorvastatin 40 MG tablet Commonly known as: LIPITOR Take 40 mg by mouth at bedtime.   empagliflozin 25 MG Tabs tablet Commonly known as: JARDIANCE Take 12.5 mg by mouth daily.   ferrous sulfate 325 (65 FE) MG tablet Take 325 mg by mouth daily with breakfast.   finasteride 5 MG tablet Commonly known as: PROSCAR Take 1 tablet by mouth daily.   Fluticasone-Umeclidin-Vilant 200-62.5-25 MCG/ACT Aepb Inhale 1 puff into the lungs daily.   Glucose 4-6 GM-MG Chew Chew 1 tablet by mouth daily.   HYDROcodone -acetaminophen  5-325 MG tablet Commonly known as: NORCO/VICODIN Take 1 tablet by mouth every 6 (six) hours as needed for moderate pain (pain score 4-6).   ipratropium-albuterol 0.5-2.5 (3) MG/3ML Soln Commonly known as: DUONEB Inhale 3 mLs into the lungs 2 (two) times daily.   meclizine  25 MG tablet Commonly known as: ANTIVERT  Take 1 tablet (25 mg total) by mouth 3 (three) times daily as needed for dizziness.   multivitamin with minerals Tabs tablet Take 1 tablet by mouth daily. 18 mg iron   ondansetron  4 MG tablet Commonly known as: ZOFRAN  Take 1 tablet (4 mg total) by mouth every 6 (six) hours.   ProAir HFA 108 (90 Base) MCG/ACT  inhaler Generic drug: albuterol Inhale 2 puffs into the lungs 3 (three) times a week.   senna 8.6 MG Tabs tablet Commonly known as: SENOKOT Take 1 tablet by mouth daily.   tamsulosin 0.4 MG Caps capsule Commonly known as: FLOMAX Take 0.4 mg by mouth daily.   vitamin C 1000 MG tablet Take 1,000 mg by mouth daily.   Xolair 150 MG injection Generic drug: omalizumab Inject 150 mg into the skin every 14 (fourteen) days.         Significant Diagnostic Studies:  No results found.  Brief H and P: For complete details please refer to admission H and P, but in brief patient is a 73 year old male presenting to [facility] for scheduled aqua ablation of the prostate with Dr. Parke Boll on 04/21/2024.  Procedure was completed without complication and patient was discharged to the floor for continuous bladder irrigation and overnight monitoring.  On my arrival he had been ambulating over the course of the morning and pain was well-controlled.  CBI was light pink on low gtt.  This was clamped and reevaluated a couple hours later with further improvement.  Shared decision was made to discharge home.   Case and plan reviewed with Dr. Parke Boll.  Hospital Course:  Principal Problem:   BPH (benign prostatic hyperplasia)   Day of Discharge BP 115/70 (BP Location: Right Arm)   Pulse 72   Temp 98 F (36.7 C)  Resp 18   Ht 6\' 2"  (1.88 m)   Wt 103 kg   SpO2 96%   BMI 29.15 kg/m   Results for orders placed or performed during the hospital encounter of 04/21/24 (from the past 24 hours)  Glucose, capillary     Status: None   Collection Time: 04/21/24 10:49 AM  Result Value Ref Range   Glucose-Capillary 86 70 - 99 mg/dL  Glucose, capillary     Status: None   Collection Time: 04/21/24  1:19 PM  Result Value Ref Range   Glucose-Capillary 72 70 - 99 mg/dL   Comment 1 Notify RN    Comment 2 Document in Chart    Comment 3 Call MD NNP PA CNM   Glucose, capillary     Status: None    Collection Time: 04/21/24  3:15 PM  Result Value Ref Range   Glucose-Capillary 90 70 - 99 mg/dL  Glucose, capillary     Status: Abnormal   Collection Time: 04/21/24  8:59 PM  Result Value Ref Range   Glucose-Capillary 216 (H) 70 - 99 mg/dL  Glucose, capillary     Status: Abnormal   Collection Time: 04/22/24  8:07 AM  Result Value Ref Range   Glucose-Capillary 183 (H) 70 - 99 mg/dL    Physical Exam: General: Alert and awake oriented x3 not in any acute distress. CVS: S1-S2 clear no murmur rubs or gallops Chest: Respiratory rate WNL Abdomen: soft nontender, nondistended, normal bowel sounds, no organomegaly Extremities: no cyanosis, clubbing or edema noted bilaterally   Disposition: Home today  Diet: Regular  Activity: Limit lifting to 10 pounds until reevaluated in clinic  DISCHARGE FOLLOW-UP  Patient is scheduled to follow-up in clinic on Thursday for voiding trial  Time spent on Discharge:  A total of 40 minutes was spent in planning, education, and discharge.  SignedKyra Phy Diamond Jentz 04/22/2024, 10:40 AM

## 2024-04-22 NOTE — Progress Notes (Signed)
   04/22/24 1008  TOC Brief Assessment  Insurance and Status Reviewed  Patient has primary care physician Yes  Home environment has been reviewed Resides in single family home with spouse  Prior level of function: Independent with ADLs at baseline  Prior/Current Home Services No current home services  Social Drivers of Health Review SDOH reviewed no interventions necessary  Readmission risk has been reviewed Yes  Transition of care needs no transition of care needs at this time

## 2024-04-22 NOTE — Anesthesia Postprocedure Evaluation (Signed)
 Anesthesia Post Note  Patient: Darrell Young  Procedure(s) Performed: ABLATION, PROSTATE, TRANSURETHRAL, USING WATERJET     Patient location during evaluation: PACU Anesthesia Type: General Level of consciousness: awake and alert Pain management: pain level controlled Vital Signs Assessment: post-procedure vital signs reviewed and stable Respiratory status: spontaneous breathing, nonlabored ventilation, respiratory function stable and patient connected to nasal cannula oxygen Cardiovascular status: blood pressure returned to baseline and stable Postop Assessment: no apparent nausea or vomiting Anesthetic complications: yes   Encounter Notable Events  Notable Event Outcome Phase Comment  Difficult to intubate - expected  Intraprocedure Filed from anesthesia note documentation.    Last Vitals:  Vitals:   04/22/24 0435 04/22/24 0838  BP: 115/70   Pulse: 72   Resp: 18   Temp: 36.7 C   SpO2: 98% 96%    Last Pain:  Vitals:   04/22/24 1225  TempSrc:   PainSc: 5                  Corretta Munce S

## 2024-04-23 LAB — SURGICAL PATHOLOGY

## 2024-10-29 ENCOUNTER — Emergency Department (HOSPITAL_COMMUNITY)
Admission: EM | Admit: 2024-10-29 | Discharge: 2024-10-29 | Disposition: A | Discharge status: Home / Self Care | Attending: Emergency Medicine | Admitting: Emergency Medicine

## 2024-10-29 ENCOUNTER — Other Ambulatory Visit: Payer: Self-pay

## 2024-10-29 ENCOUNTER — Encounter (HOSPITAL_COMMUNITY): Payer: Self-pay | Admitting: *Deleted

## 2024-10-29 DIAGNOSIS — L989 Disorder of the skin and subcutaneous tissue, unspecified: Secondary | ICD-10-CM | POA: Diagnosis not present

## 2024-10-29 DIAGNOSIS — Z8546 Personal history of malignant neoplasm of prostate: Secondary | ICD-10-CM | POA: Insufficient documentation

## 2024-10-29 DIAGNOSIS — R1032 Left lower quadrant pain: Secondary | ICD-10-CM | POA: Insufficient documentation

## 2024-10-29 NOTE — ED Triage Notes (Signed)
 Pt c/o painful, raised area to left groin; pt states he noticed it on Monday and states it has not got any larger since he first noticed it

## 2024-10-29 NOTE — ED Provider Notes (Signed)
 [place] [facility]PARTMENT AT [facility] Provider Note   CSN: [id number] Arrival date & time: 10/29/24  9365     Patient presents with: Groin Pain   Darrell Young is a 73 y.o. male.   HPI With a notable history of prostate cancer presents with concern for new discomfort and lesion in the left groin. He states in general he feels 10+ and great but over the past 2 days or so has noticed new lesion in the left groin.  Prior to this he noticed 1 seemingly infected hair follicle in the same proximity, this has resolved.  On subsequent evaluation he noticed a patch of darkened tissue approximately 1 cm in diameter in that area.  He has some discomfort in the region, but no urinary complaints, no scrotal swelling, no penile abnormalities, and as above generally feels normal. He states that the lesion is new, and with his history of prostate cancer he is concerned about possible malignancy.    Prior to Admission medications   Medication Sig Start Date End Date Taking? Authorizing Provider  acetaminophen  (TYLENOL ) 325 MG tablet Take 2 tablets (650 mg total) by mouth every 6 (six) hours as needed for up to 30 doses for mild pain or moderate pain. 06/12/20   Cottie Donnice PARAS, MD  albuterol  (PROAIR  HFA) 108 [phone] Base) MCG/ACT inhaler Inhale 2 puffs into the lungs 3 (three) times a week.    [provider]  amoxicillin -clavulanate (AUGMENTIN ) 875-125 MG tablet Take 1 tablet by mouth 2 (two) times daily. 04/21/24   Carolee Sherwood JONETTA DOUGLAS, MD  Ascorbic Acid (VITAMIN C) 1000 MG tablet Take 1,000 mg by mouth daily.    [provider]  atorvastatin  (LIPITOR) 40 MG tablet Take 40 mg by mouth at bedtime.    [provider]  empagliflozin (JARDIANCE) 25 MG TABS tablet Take 12.5 mg by mouth daily. 01/01/24   [provider]  ferrous sulfate  325 (65 FE) MG tablet Take 325 mg by mouth daily with breakfast.    [provider]  finasteride  (PROSCAR ) 5 MG  tablet Take 1 tablet by mouth daily.    [provider]  Fluticasone-Umeclidin-Vilant 200-62.5-25 MCG/ACT AEPB Inhale 1 puff into the lungs daily.    [provider]  Glucose 4-6 GM-MG CHEW Chew 1 tablet by mouth daily.    [provider]  HYDROcodone -acetaminophen  (NORCO/VICODIN) 5-325 MG tablet Take 1 tablet by mouth every 6 (six) hours as needed for moderate pain (pain score 4-6). 04/21/24   Carolee Sherwood JONETTA DOUGLAS, MD  ipratropium-albuterol  (DUONEB) 0.5-2.5 (3) MG/3ML SOLN Inhale 3 mLs into the lungs 2 (two) times daily. 05/07/23   [provider]  meclizine  (ANTIVERT ) 25 MG tablet Take 1 tablet (25 mg total) by mouth 3 (three) times daily as needed for dizziness. Patient taking differently: Take 12.5 mg by mouth 2 (two) times daily. 01/05/24   Neysa Caron PARAS, DO  Multiple Vitamin (MULTIVITAMIN WITH MINERALS) TABS tablet Take 1 tablet by mouth daily. 18 mg iron    [provider]  omalizumab (XOLAIR) 150 MG injection Inject 150 mg into the skin every 14 (fourteen) days. 12/01/20   [provider]  ondansetron  (ZOFRAN ) 4 MG tablet Take 1 tablet (4 mg total) by mouth every 6 (six) hours. Patient not taking: Reported on 04/01/2024 01/05/24   Neysa Caron PARAS, DO  senna (SENOKOT) 8.6 MG TABS tablet Take 1 tablet by mouth daily.    [provider]  tamsulosin (FLOMAX) 0.4  MG CAPS capsule Take 0.4 mg by mouth daily.    [provider]    Allergies: Aspirin and Shrimp (diagnostic)    Review of Systems  Updated Vital Signs BP 129/81 (BP Location: Right Arm)   Pulse 66   Temp 97.9 F (36.6 C) (Oral)   Resp 16   Ht 1.88 m (6' 2)   Wt 97.5 kg   SpO2 98%   BMI 27.60 kg/m   Physical Exam Vitals and nursing note reviewed.  Constitutional:      General: He is not in acute distress.    Appearance: He is well-developed.  HENT:     Head: Normocephalic and atraumatic.  Eyes:     Conjunctiva/sclera: Conjunctivae normal.  Pulmonary:      Effort: Pulmonary effort is normal. No respiratory distress.     Breath sounds: No stridor.  Abdominal:     General: There is no distension.  Genitourinary:   Skin:    General: Skin is warm and dry.  Neurological:     Mental Status: He is alert and oriented to person, place, and time.     (all labs ordered are listed, but only abnormal results are displayed) Labs Reviewed - No data to display  EKG: None  Radiology: No results found.   Procedures   Medications Ordered in the ED - No data to display                                  Medical Decision Making Adult male presents with concern for pain, on exam has skin abnormality.  Unclear if this has been present for long, the patient states that he only recently noticed it. This denial of systemic complaints, absence of adenopathy, denial of penile or urinary abnormalities all reassuring, some suspicion for dermatologic lesion, though with new finding, in the context of cancer, patient will follow-up closely with the [facility], dermatology.  No evidence for deeper infection, no adenopathy, no evidence for bacteremia, sepsis.     Final diagnoses:  Left inguinal pain  Skin lesion    ED Discharge Orders     None          Garrick Charleston, MD 10/29/24 [phone]

## 2024-10-29 NOTE — Discharge Instructions (Signed)
 With today's evaluation demonstrating a new skin lesion on the groin it is important to follow-up with your physician and discuss appropriate ongoing evaluation, likely involving dermatology.  Return here for concerning changes in your condition.
